# Patient Record
Sex: Male | Born: 1945
Health system: Southern US, Community
[De-identification: ages and names within clinical notes are randomized; demographics above are authoritative.]

## PROBLEM LIST (undated history)

## (undated) DIAGNOSIS — I447 Left bundle-branch block, unspecified: Secondary | ICD-10-CM

## (undated) DIAGNOSIS — G20 Parkinson's disease: Secondary | ICD-10-CM

## (undated) DIAGNOSIS — K219 Gastro-esophageal reflux disease without esophagitis: Secondary | ICD-10-CM

## (undated) DIAGNOSIS — G20A1 Parkinson's disease without dyskinesia, without mention of fluctuations: Secondary | ICD-10-CM

## (undated) DIAGNOSIS — J439 Emphysema, unspecified: Secondary | ICD-10-CM

## (undated) DIAGNOSIS — Z8719 Personal history of other diseases of the digestive system: Secondary | ICD-10-CM

## (undated) DIAGNOSIS — I10 Essential (primary) hypertension: Secondary | ICD-10-CM

## (undated) DIAGNOSIS — M1990 Unspecified osteoarthritis, unspecified site: Secondary | ICD-10-CM

## (undated) HISTORY — PX: KNEE ARTHROSCOPY: SHX127

## (undated) HISTORY — PX: COLON SURGERY: SHX602

## (undated) HISTORY — PX: ANKLE FRACTURE SURGERY: SHX122

---

## 2006-10-28 ENCOUNTER — Ambulatory Visit (HOSPITAL_COMMUNITY): Admission: RE | Admit: 2006-10-28 | Discharge: 2006-10-28 | Payer: Self-pay | Admitting: Internal Medicine

## 2006-10-28 ENCOUNTER — Ambulatory Visit: Payer: Self-pay | Admitting: Internal Medicine

## 2006-11-26 ENCOUNTER — Ambulatory Visit: Payer: Self-pay | Admitting: Internal Medicine

## 2006-12-05 ENCOUNTER — Ambulatory Visit: Payer: Self-pay | Admitting: Internal Medicine

## 2006-12-05 ENCOUNTER — Ambulatory Visit (HOSPITAL_COMMUNITY): Admission: RE | Admit: 2006-12-05 | Discharge: 2006-12-05 | Payer: Self-pay | Admitting: Internal Medicine

## 2006-12-12 ENCOUNTER — Ambulatory Visit (HOSPITAL_COMMUNITY): Admission: RE | Admit: 2006-12-12 | Discharge: 2006-12-12 | Payer: Self-pay | Admitting: Internal Medicine

## 2006-12-24 ENCOUNTER — Ambulatory Visit (HOSPITAL_COMMUNITY): Admission: RE | Admit: 2006-12-24 | Discharge: 2006-12-24 | Payer: Self-pay | Admitting: Internal Medicine

## 2007-01-15 ENCOUNTER — Ambulatory Visit: Payer: Self-pay | Admitting: Internal Medicine

## 2007-05-05 ENCOUNTER — Ambulatory Visit (HOSPITAL_COMMUNITY): Admission: RE | Admit: 2007-05-05 | Discharge: 2007-05-05 | Payer: Self-pay | Admitting: Internal Medicine

## 2007-05-28 ENCOUNTER — Ambulatory Visit: Payer: Self-pay | Admitting: Internal Medicine

## 2011-04-03 NOTE — Assessment & Plan Note (Signed)
Jeff Greer, Jeff Greer               CHART#:  [id number]   DATE:  05/28/2007                       DOB:  [birth year]   Follow up epigastric pain, large hiatal hernia, Hemoccult positive  anemia.  Last seen January 15, 2007.  Prior colonoscopy and EGD  demonstrated no significant abnormalities aside from left-sided  diverticula and internal hemorrhoids.  He had a large hiatal hernia on  EGD.  This was confirmed on upper GI series.  Gallbladder ultrasound  also negative.  He did have a 9-mm nonspecific liver lesion on CT  previously.  This was repeated just on May 05, 2007.  I reviewed these  studies with Dr. Leanna Battles and our radiologist at [facility].  She  felt this was a simple benign lesion most likely representing either a  cyst or hemangioma, and did not feel any further evaluation was  warranted.  He has done well on Zegerid 40 mg orally daily, and wishes  to go back on omeprazole 20 mg orally b.i.d.  He has cut back  significantly on his Sundrop intake.  He has lost 5 pounds since his  last visit.  He feels he is doing very well.   Repeat CBC from April 29, 2007 demonstrated a normal white count, H and H  16.1 and 48.7, platelet count 212,000.   There is no family history of GI malignancy.   CURRENT MEDICATIONS:  See updated list.   ALLERGIES:  NO KNOWN DRUG ALLERGIES.   EXAMINATION:  He looks well.  Weight 195.5.  Height 5 feet 10 inches.  Temperature 98.1.  Blood pressure 150/90.  Pulse is 60.  SKIN:  Warm and dry.  There is no jaundice.  CHEST:  Lungs are clear to auscultation.  CARDIAC:  Regular rate and rhythm without murmur, gallop, or rub.  ABDOMEN:  Nondistended.  He has some scratches on his anterior abdominal  wall, which he says he got when he nearly fell out of a tree putting up  a ladder against a tree to [place] a deer stand.  Positive bowel  sounds.  Soft.  Entirely nontender without appreciable mass or  organomegaly.   ASSESSMENT:  Large hiatal  hernia/gastroesophageal reflux disease likely  the cause of the patient's recent symptoms.  Things have really settled  down with cutting back significantly on caffeinated carbonated beverages  and Zegerid 40 mg daily.  Because of cost, he would like to go back to  Prilosec 20 mg OTC.  I told him he could go ahead and do that, but I  would plan to go ahead and take 20 mg twice daily for now, and  eventually get down to once daily assuming  symptoms are under good control.  Unless something comes up, plan to see  this nice gentleman back in 6 months.       Jonathon Bellows, M.D.  Electronically Signed     RMR/MEDQ  D:  05/28/2007  T:  05/29/2007  Job:  [id number]   cc:   [name], MD

## 2011-04-06 NOTE — Op Note (Signed)
NAME:  Jeff Greer, Jeff Greer              ACCOUNT NO.:  [id number]   MEDICAL RECORD NO.:  [id number]          PATIENT TYPE:  AMB   LOCATION:  DAY                           FACILITY:  [facility]   PHYSICIAN:  R. Roetta Sessions, M.D. DATE OF BIRTH:  [birth year]   DATE OF PROCEDURE:  12/05/2006  DATE OF DISCHARGE:                               OPERATIVE REPORT   INDICATIONS FOR PROCEDURE:  The patient is a 65 year old gentleman with  a history of microcytic anemia, Hemoccult positive previously.  He  underwent a colonoscopy which revealed no significant findings.  He also  has vague upper abdominal pain, intermittent in nature.  Gallbladder  remains in situ, has not been evaluated thus far.  EGD is now being  done.  This approach has been discussed at length with the patient.  The  potential risks, benefits and alternatives have been discussed.  His  questions were answered. He is agreeable.  He had a CBC through our  office on 11/20/2007 which revealed a normal H&H 13.8, 42.3, normal, MCV  85.1, white count 6.2.  Please see documents in medical records for more  information.   PROCEDURE NOTE:  O2 saturation, blood pressure, pulse were monitored the  entire procedure.  Conscious sedation with Versed 3 mg IV, Demerol 75 mg  IV in divided doses.  Instrument Pentax video chip system.  Cetacaine  spray topical pharyngeal anesthesia.   FINDINGS:  Examination of the tubular esophagus revealed no mucosal  abnormalities.  EG junction was patent, easily traversed.  Stomach:  Gastric cavity was not empty.  There was quite a bit of the food  vegetable material in the fundus which precluded complete examination.  The stomach, otherwise inflated well with air.  Examination of the  gastric mucosa including retroflexion proximal stomach esophagogastric  junction demonstrated a large somewhat complex hiatal hernia.  This  appeared to be a diaphragmatic hernia only but there was quite a bit of  food debris in the  fundus for which I could not see several square  centimeters of the proximal gastric mucosa.  Pylorus was patent, easily  traversed.  Examination of the bulb, second portion revealed no  abnormalities.   Therapeutic/diagnostic maneuvers performed:  None.   The patient tolerated the procedure well, was reactive after endoscopy.   IMPRESSION:  Normal esophagus.  Large hiatal hernia.  Significant food  debris retained in the fundus precluded complete examination.  Large  hiatal hernia.  Otherwise gastric mucosa which was seen appeared normal,  patent pylorus, normal D1-D2.   RECOMMENDATIONS:  Will proceed with an upper GI series to image the  proximal stomach not seen well today and we will also obtain a  gallbladder ultrasound at the same time and make further recommendations  at the very near future.  He is to continue omeprazole for now.      Jonathon Bellows, M.D.  Electronically Signed     RMR/MEDQ  D:  12/05/2006  T:  12/05/2006  Job:  [id number]   cc:   Dr. Sherryll Burger - Internal Medicine

## 2011-04-06 NOTE — H&P (Signed)
NAME:  Jeff Greer, Jeff Greer              ACCOUNT NO.:  [id number]   MEDICAL RECORD NO.:  [id number]          PATIENT TYPE:  AMB   LOCATION:                                FACILITY:  [facility]   PHYSICIAN:  R. Roetta Sessions, M.D. DATE OF BIRTH:  03-Mar-1946   DATE OF ADMISSION:  11/26/2006  DATE OF DISCHARGE:  LH                              HISTORY & PHYSICAL   DATE OF VISIT:  11/26/2006   CHIEF COMPLAINT:  Followup of blood work.   HISTORY OF PRESENT ILLNESS:  Jeff Greer is a 65 year old gentleman who  recently underwent colonoscopy for what was believed to be screening  purposes, however, he told Jeff Greer that he had a history of anemia and  Hemoccult positive stools.  On colonoscopy he is found to have anal  papilla, long tortuous colon and left-sided diverticula, otherwise  unremarkable.  He underwent a CBC that day, with a hemoglobin mildly low  at 13 and an MCV was 82.5. Repeat hemoglobin on 11/18/2006 it was 13.8  and hematocrit 42.3. We received labs from Jeff Greer office that showed  that he had hemoglobin of 8.8 on 09/08/2007 with an MCV of 69 and a  ferritin of 6.  The patient has been on aspirin 81 mg daily.  He  complains of reflux off and on for about 3 years.  He saw Jeff Greer,  recently and was put on Prilosec which has helped with his heartburn  symptoms.  He complains about upper abdominal pain which may or may not  be related to meals.  He really has not paid much attention to when it  occurs however.  His wife states that it often happens with meals.  He  generally has a bowel movement every 2-3 days which is normal for him.  He denies any melena or rectal bleeding, although he states that he has  not paid much attention.  His weight has been stable.   CURRENT MEDICATIONS:  Aspirin 81 mg daily, metoprolol 50 mg daily,  omeprazole 20 mg daily.   ALLERGIES:  No known drug allergies.   PAST MEDICAL HISTORY:  Hypertension.   PAST SURGICAL HISTORY:  He has had surgery  for broken ankle; and other  minor surgeries for fractures.   FAMILY HISTORY:  Negative for colorectal cancer.   SOCIAL HISTORY:  He is married with 1 child.  He is employed in [employer].  He is very active and walks about 10 miles a day.  He  quit smoking over 20 years ago.  Rarely consumes alcohol.   REVIEW OF SYSTEMS:  Please see HPI for GI and Constitutional.  CARDIOPULMONARY:  No chest pain or shortness of breath.   PHYSICAL EXAMINATION:  VITAL SIGNS:  Weight 199.  Height 5 feet 10  inches.  Temperature 98.3, blood pressure 138/88, pulse 74.  GENERAL:  A pleasant, well-nourished, well-developed, Caucasian male in  no acute distress.  SKIN:  Warm and dry.  No jaundice.  HEENT:  Sclerae anicteric.  Oropharyngeal mucosa moist and pink.  No  lymphadenopathy.  CHEST:  Lungs are  clear to auscultation.  CARDIAC EXAM:  Reveals regular rate and rhythm.  No murmurs, rubs, or  gallops.  ABDOMEN:  Positive bowel sounds, soft, nondistended.  There is mild  tenderness in the right upper quadrant region, and just to the right of  the epigastrium to deep palpation. No organomegaly or masses.  No  rebound tenderness or guarding.  No abdominal bruits or hernias.  EXTREMITIES:  No edema.   IMPRESSION:  Jeff Greer is a 64 year old gentleman who back in August  of 2007 had a significant microcytic anemia with low ferritin, as  outlined above.  He also was Hemoccult positive at the time. We  performed what was felt to be a screening colonoscopy several weeks ago  which was unremarkable.  The patient states that she has had ongoing,  intermittent, upper abdominal pain.  We need to exclude the possibility  of peptic ulcer disease.  He has been on low-dose aspirin daily.  Gallbladder remains in situ and cannot be excluded as far as the cause  of the abdominal pain.  Currently his hemoglobin is normal which is  reassuring.   PLAN:  1. EGD in the near future with Jeff Greer.  I  discussed the risks,      alternatives, and benefits with regards to the risk of reaction to      medication, bleeding, infection, and perforation.  The patient is      agreeable to proceed.  He will hold aspirin for 3 days prior to      procedure.  2. Continue omeprazole as before.  3. If upper endoscopy is unremarkable, would pursue gallbladder      workup.      Jeff Greer, P.AJonathon Greer, M.D.  Electronically Signed    LL/MEDQ  D:  11/26/2006  T:  11/26/2006  Job:  [id number]

## 2011-04-06 NOTE — Op Note (Signed)
NAME:  Jeff Greer, Jeff Greer              ACCOUNT NO.:  [id number]   MEDICAL RECORD NO.:  [id number]          PATIENT TYPE:  AMB   LOCATION:  DAY                           FACILITY:  [facility]   PHYSICIAN:  R. Roetta Sessions, M.D. DATE OF BIRTH:  [birth year]   DATE OF PROCEDURE:  10/28/2006  DATE OF DISCHARGE:                               OPERATIVE REPORT   PROCEDURE:  Screening colonoscopy.   INDICATIONS FOR PROCEDURE:  Patient is a 65 year old Caucasian male  devoid of any lower GI tract symptoms referred by Dr. Sherryll Burger in [place],  [place], for a colorectal cancer screening. Mr. Lemming tells me  has no symptoms but also tells me he was found to have a low blood  count and was told he is found have blood in stool, although Mr.  Antonini denies odynophagia, dysphagia, __________ or reflux symptoms,  nausea, vomiting, abdominal pain, change in weight, melena or rectal  bleeding or any other change in bowel function.  There is no family  history of colorectal neoplasia, placed takes one 81 mg aspirin daily.  Colonoscopy is now being done.  This approach has been discussed with  the patient at length.  Potential risks, benefits and alternatives have  been reviewed, questions answered and she is agreeable.  Please see  documentation in the medical record.   PROCEDURE NOTE:  O2 saturation, blood pressure, pulse and respirations  were monitor throughout the entire procedure.   CONSCIOUS SEDATION:  Versed 5 mg IV, Demerol 100 mg IV in divided doses.   INSTRUMENT:  Olympus video chip system.   FINDINGS:  Digital rectal exam revealed no abnormalities.  The prep was  good.   Colon:  Colonic mucosa was surveyed from the rectosigmoid junction to  the left transverse and right colon to the appendiceal orifice,  ileocecal valve and cecum.  These structures were well seen and  photographed for the record.  From this level, the scope was withdrawn  and all previously mentioned mucosal surfaces were  again seen.  The  patient had a long tortuous colon requiring number of positions to reach  the cecum.  Ultimately, the patient was placed in the supine position  for the cecum to be intubated.  The patient a long tortuous colon, he  had a left-sided diverticula, however, the remainder of the colonic  mucosa appeared normal.  Scope was pulled back down into the rectum  where thorough examination of the rectal mucosa including retroflexed  view of the anal verge was undertaken.  The patient had three anal  papilla, otherwise the rectal mucosa appeared normal.  The patient  tolerated the procedure well and was reactive.   ENDOSCOPIC IMPRESSION:  Anal papilla, otherwise normal rectum, long  tortuous colon, left-sided diverticula.  Remainder of colon mucosa  appeared normal.   The patient tells me that he has a low blood count and blood was found  in his stool although he was referred by Dr. Margaretmary Eddy office for  screening.   RECOMMENDATIONS:  Will check a CBC today and see where we stand and go  from there.  Jonathon Bellows, M.D.  Electronically Signed     RMR/MEDQ  D:  10/28/2006  T:  10/28/2006  Job:  [id number]   cc:   Dr. Janene Madeira, [place]

## 2014-11-19 DIAGNOSIS — Z9289 Personal history of other medical treatment: Secondary | ICD-10-CM

## 2014-11-19 HISTORY — DX: Personal history of other medical treatment: Z92.89

## 2015-11-02 ENCOUNTER — Encounter (HOSPITAL_COMMUNITY): Payer: Self-pay

## 2015-11-02 ENCOUNTER — Other Ambulatory Visit: Payer: Self-pay

## 2015-11-02 ENCOUNTER — Encounter (HOSPITAL_COMMUNITY)
Admission: RE | Admit: 2015-11-02 | Discharge: 2015-11-02 | Disposition: A | Discharge status: Home / Self Care | Payer: Medicare Other | Source: Ambulatory Visit | Attending: Orthopaedic Surgery | Admitting: Orthopaedic Surgery

## 2015-11-02 ENCOUNTER — Ambulatory Visit (HOSPITAL_COMMUNITY)
Admission: RE | Admit: 2015-11-02 | Discharge: 2015-11-02 | Disposition: A | Discharge status: Home / Self Care | Payer: Medicare Other | Source: Ambulatory Visit | Attending: Orthopedic Surgery | Admitting: Orthopedic Surgery

## 2015-11-02 DIAGNOSIS — J449 Chronic obstructive pulmonary disease, unspecified: Secondary | ICD-10-CM | POA: Insufficient documentation

## 2015-11-02 DIAGNOSIS — Z01810 Encounter for preprocedural cardiovascular examination: Secondary | ICD-10-CM | POA: Insufficient documentation

## 2015-11-02 DIAGNOSIS — I447 Left bundle-branch block, unspecified: Secondary | ICD-10-CM | POA: Diagnosis not present

## 2015-11-02 DIAGNOSIS — R9431 Abnormal electrocardiogram [ECG] [EKG]: Secondary | ICD-10-CM | POA: Diagnosis not present

## 2015-11-02 DIAGNOSIS — Z01812 Encounter for preprocedural laboratory examination: Secondary | ICD-10-CM | POA: Insufficient documentation

## 2015-11-02 DIAGNOSIS — Z01818 Encounter for other preprocedural examination: Secondary | ICD-10-CM | POA: Diagnosis not present

## 2015-11-02 DIAGNOSIS — I10 Essential (primary) hypertension: Secondary | ICD-10-CM | POA: Diagnosis not present

## 2015-11-02 DIAGNOSIS — J984 Other disorders of lung: Secondary | ICD-10-CM | POA: Diagnosis not present

## 2015-11-02 DIAGNOSIS — K449 Diaphragmatic hernia without obstruction or gangrene: Secondary | ICD-10-CM | POA: Insufficient documentation

## 2015-11-02 HISTORY — DX: Gastro-esophageal reflux disease without esophagitis: K21.9

## 2015-11-02 HISTORY — DX: Parkinson's disease without dyskinesia, without mention of fluctuations: G20.A1

## 2015-11-02 HISTORY — DX: Emphysema, unspecified: J43.9

## 2015-11-02 HISTORY — DX: Unspecified osteoarthritis, unspecified site: M19.90

## 2015-11-02 HISTORY — DX: Essential (primary) hypertension: I10

## 2015-11-02 HISTORY — DX: Personal history of other diseases of the digestive system: Z87.19

## 2015-11-02 HISTORY — DX: Parkinson's disease: G20

## 2015-11-02 LAB — COMPREHENSIVE METABOLIC PANEL
ALK PHOS: 75 U/L (ref 38–126)
ALT: 10 U/L — AB (ref 17–63)
AST: 20 U/L (ref 15–41)
Albumin: 3.9 g/dL (ref 3.5–5.0)
Anion gap: 7 (ref 5–15)
BUN: 13 mg/dL (ref 6–20)
CALCIUM: 9.1 mg/dL (ref 8.9–10.3)
CO2: 27 mmol/L (ref 22–32)
CREATININE: 0.8 mg/dL (ref 0.61–1.24)
Chloride: 106 mmol/L (ref 101–111)
GFR calc non Af Amer: >60 mL/min (ref >60)
GLUCOSE: 95 mg/dL (ref 65–99)
Potassium: 3.9 mmol/L (ref 3.5–5.1)
SODIUM: 140 mmol/L (ref 135–145)
Total Bilirubin: 0.4 mg/dL (ref 0.3–1.2)
Total Protein: 6.9 g/dL (ref 6.5–8.1)

## 2015-11-02 LAB — CBC WITH DIFFERENTIAL/PLATELET
Basophils Absolute: 0 10*3/uL (ref 0.0–0.1)
Basophils Relative: 0 %
EOS ABS: 0.4 10*3/uL (ref 0.0–0.7)
Eosinophils Relative: 6 %
HCT: 43.9 % (ref 39.0–52.0)
HEMOGLOBIN: 14.6 g/dL (ref 13.0–17.0)
LYMPHS ABS: 2.4 10*3/uL (ref 0.7–4.0)
LYMPHS PCT: 33 %
MCH: 31.7 pg (ref 26.0–34.0)
MCHC: 33.3 g/dL (ref 30.0–36.0)
MCV: 95.2 fL (ref 78.0–100.0)
Monocytes Absolute: 0.6 10*3/uL (ref 0.1–1.0)
Monocytes Relative: 8 %
NEUTROS PCT: 53 %
Neutro Abs: 3.9 10*3/uL (ref 1.7–7.7)
Platelets: 215 10*3/uL (ref 150–400)
RBC: 4.61 MIL/uL (ref 4.22–5.81)
RDW: 12.6 % (ref 11.5–15.5)
WBC: 7.3 10*3/uL (ref 4.0–10.5)

## 2015-11-02 LAB — URINALYSIS, ROUTINE W REFLEX MICROSCOPIC
Bilirubin Urine: NEGATIVE
Glucose, UA: NEGATIVE mg/dL
Hgb urine dipstick: NEGATIVE
KETONES UR: NEGATIVE mg/dL
NITRITE: NEGATIVE
PROTEIN: NEGATIVE mg/dL
Specific Gravity, Urine: 1.015 (ref 1.005–1.030)
pH: 7 (ref 5.0–8.0)

## 2015-11-02 LAB — PROTIME-INR
INR: 1.07 (ref 0.00–1.49)
Prothrombin Time: 14.1 seconds (ref 11.6–15.2)

## 2015-11-02 LAB — URINE MICROSCOPIC-ADD ON
Bacteria, UA: NONE SEEN
RBC / HPF: NONE SEEN RBC/hpf (ref 0–5)

## 2015-11-02 LAB — ABO/RH: ABO/RH(D): O POS

## 2015-11-02 LAB — TYPE AND SCREEN
ABO/RH(D): O POS
Antibody Screen: NEGATIVE

## 2015-11-02 LAB — SURGICAL PCR SCREEN
MRSA, PCR: NEGATIVE
STAPHYLOCOCCUS AUREUS: POSITIVE — AB

## 2015-11-02 LAB — APTT: aPTT: 30 seconds (ref 24–37)

## 2015-11-02 NOTE — Pre-Procedure Instructions (Signed)
Jeff Greer  11/02/2015      [facility] [address], [place] - [address] [place] [address] Phone: [phone] Fax: [phone]    Your procedure is scheduled on    Tuesday  11/08/15   Report to [facility] Admitting at 530 A.M.  Call this number if you have problems the morning of surgery:  [phone]   Remember:  Do not eat food or drink liquids after midnight.  Take these medicines the morning of surgery with A SIP OF WATER   CARBIDOPA-LEVODOPA, METOPROLOL(TOPROL), OMEPRAZOLE , TRIHEXPHENIDYL (STOP ASPIRIN, VITAMIN, DICLOFENAC)   Do not wear jewelry, make-up or nail polish.  Do not wear lotions, powders, or perfumes.  You may wear deodorant.  Do not shave 48 hours prior to surgery.  Men may shave face and neck.  Do not bring valuables to the hospital.  [facility] is not responsible for any belongings or valuables.  Contacts, dentures or bridgework may not be worn into surgery.  Leave your suitcase in the car.  After surgery it may be brought to your room.  For patients admitted to the hospital, discharge time will be determined by your treatment team.  Patients discharged the day of surgery will not be allowed to drive home.   Name and phone number of your driver:  Special instructions:  [facility] - Preparing for Surgery  Before surgery, you can play an important role.  Because skin is not sterile, your skin needs to be as free of germs as possible.  You can reduce the number of germs on you skin by washing with CHG (chlorahexidine gluconate) soap before surgery.  CHG is an antiseptic cleaner which kills germs and bonds with the skin to continue killing germs even after washing.  Please DO NOT use if you have an allergy to CHG or antibacterial soaps.  If your skin becomes reddened/irritated stop using the CHG and inform your nurse when you arrive at Short Stay.  Do not shave (including legs and underarms) for at  least 48 hours prior to the first CHG shower.  You may shave your face.  Please follow these instructions carefully:   1.  Shower with CHG Soap the night before surgery and the                                morning of Surgery.  2.  If you choose to wash your hair, wash your hair first as usual with your       normal shampoo.  3.  After you shampoo, rinse your hair and body thoroughly to remove the                      Shampoo.  4.  Use CHG as you would any other liquid soap.  You can apply chg directly       to the skin and wash gently with scrungie or a clean washcloth.  5.  Apply the CHG Soap to your body ONLY FROM THE NECK DOWN.        Do not use on open wounds or open sores.  Avoid contact with your eyes,       ears, mouth and genitals (private parts).  Wash genitals (private parts)       with your normal soap.  6.  Wash thoroughly, paying special attention to  the area where your surgery        will be performed.  7.  Thoroughly rinse your body with warm water from the neck down.  8.  DO NOT shower/wash with your normal soap after using and rinsing off       the CHG Soap.  9.  Pat yourself dry with a clean towel.            10.  Wear clean pajamas.            11.  Place clean sheets on your bed the night of your first shower and do not        sleep with pets.  Day of Surgery  Do not apply any lotions/deoderants the morning of surgery.  Please wear clean clothes to the hospital/surgery center.    Please read over the following fact sheets that you were given. Pain Booklet, Coughing and Deep Breathing, Blood Transfusion Information, MRSA Information and Surgical Site Infection Prevention

## 2015-11-02 NOTE — Progress Notes (Signed)
REQUESTED STRESS TEST FROM DR. [facility] .

## 2015-11-02 NOTE — Progress Notes (Signed)
REQUESTED STRESS TEST FROM DR. [facility] IN [place].

## 2015-11-03 NOTE — Progress Notes (Addendum)
Anesthesia Chart Review:  Pt is 69 year old male scheduled for L total knee arthroplasty on 11/08/2015 with Dr. Durward Fortes.   PMH includes:  HTN, emphysema, Parkinson's disease, GERD. Former smoker. BMI 27.   Medications include: ASA, carbidopa-levodopa, lisinopril, metoprolol, prilosec, trihexyphenidyl.   Preoperative labs reviewed.    Chest x-ray 11/02/15 reviewed.  1. COPD and bilateral pleural-parenchymal scarring. 2. Prominent sliding hiatal hernia.  EKG 11/02/15: Sinus rhythm with Fusion complexes. LBBB.   Nuclear stress test 05/17/10:  - Small defect of anteroseptum which was slightly more significant on the resting study compared with the rest study. This appears to be due to motion artifacts. The remainder of the walls were normally perfused. There was no significant ischemia noted.  * Note stress test report indicates LBBB was noted on resting EKG  If no changes, I anticipate pt can proceed with surgery as scheduled.   Willeen Cass, FNP-BC [facility] Short Stay Surgical Center/Anesthesiology Phone: [phone] 11/04/2015 3:26 PM

## 2015-11-03 NOTE — H&P (Signed)
CHIEF COMPLAINT:  Painful left knee.    HISTORY OF PRESENT ILLNESS:  Jeff Greer is a very pleasant 69 year old white male who is seen today for evaluation of his left knee.  He has had problems with his left knee with an acute onset of pain and discomfort in April 2016.  He did not have any history of injury or trauma at that time, but he is a logger and works with heavy equipment, but did not remember a particular incident that caused his pain.  He had a lot of popping sensation and his knee was feeling as if it was giving way.  He had one incident where the knee pain caused him to fall at that time.  He did have a corticosteroid injection done on May 9, but apparently it did not make much of a difference at that time.  He has iced it, used Ben-Gay and actually taken hydrocodone.  He continued to have pain and discomfort in the knee.  Was seen in our practice on March 07, 2015.  An MRI scan was ordered, which showed a large complex tear in the posterior horn and posterior body of the medial meniscus with a horizontal component reaching the meniscal surface and a large radial component.  A meniscal fragment was displaced along the medial femoral condyle, according to the MRI scan.  Lateral meniscus did have a large radial tear on MRI also.  There was some thinning and irregularity of the medial compartment noted at that time.  Arthroscopic surgery was performed on Apr 14, 2015, encompassing a left knee partial medial and lateral meniscectomy with chondroplasty of the medial femoral condyle.  He did have grade IV changes on the medial femoral condyle.  He was not having any swelling and his significant problem was, though, that when he got from a sitting position, it took him 3 or 4 steps before he felt better.  His last visit was in June 2016 for his knee arthroscopy.  He returned back on September 28, 2015, with recurrent pain and a corticosteroid injection was given.  When he followed back up, he was noted to  have continued pain.  He returns today for re-evaluation.     PAST MEDICAL HISTORY:  In general, his health is fair.     HOSPITALIZATIONS:   1.  In 2013, repair of an intestinal tear. 2.  On 04/13/2015, scope of the left knee with partial medial and lateral meniscectomy and chondroplasty. 3.  In 1983 for right ankle ORIF, fracture.    MEDICATIONS:     1.  Carbidopa/levodopa 25 mg t.i.d.  2.  Trihexyphenidyl 2 mg daily.  3.  Metoprolol 50 mg daily. 4.  Lisinopril 10 mg daily.  5.  Diclofenac 75 mg b.i.d.  6.  Aspirin 81 mg daily.  7.  Omeprazole 20 mg daily.    ALLERGIES:  NONE KNOWN.     REVIEW OF SYSTEMS:  A 14-point review of systems is positive for Parkinson disease, which he states is mild.  He is on medication for that.  He also has had hypertension for 41 years or so and is on meds for that.     FAMILY HISTORY:  Positive for a mother who died at age 21 from a brain tumor.  Father died at age 71 from emphysema.  He had 1 brother who was killed in a motor vehicle accident.  He had 3 other brothers, 33 year old twins and one 50 year old.  All had diabetes mellitus.  One  of the twins had open heart surgery.  He had no sisters.    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  He quit smoking cigarettes in 1985; prior that, he was smoking 2-3 packs a day for 25 years.  No use of alcohol.     PHYSICAL EXAMINATION:  Exam today reveals a very pleasant 69 year old white male, well developed, well nourished, alert, pleasant, cooperative, in moderate distress secondary to left knee pain.  He is 67-1/2 inches in height, weight 188 pounds, BMI 29.  Vital signs reveal a temperature of 98.6, pulse 64, respirations 16, blood pressure 140/78.   Head:  Normocephalic.  Eyes:  Pupils equal, round and reactive to light and accommodation with extraocular movements intact.  Ears, nose and throat:  Benign. Neck:  Supple.  No carotid bruits.  Chest:  Good expansion.     Lungs:  Essentially clear. Cardiac:   Regular rhythm and rate.  Normal S1, S2.  No murmurs were noted.     Pulses:  1+ bilateral and symmetric in the lower extremities. Abdomen:  Scaphoid, soft, nontender.  No masses palpable.  Normal bowel sounds present.  Surgical incision was noted.  Genitorectal/breast:  Exam not indicated for an orthopedic evaluation.   CNS:  He is oriented x3.  Cranial nerves II-XII grossly intact.   Musculoskeletal:  He has range of motion of the left knee from about 5 degrees to about 105 degrees.  He does have just a trace effusion.  He is mildly varus at this time at the medial joint line.  He does have patellofemoral crepitus with range of motion.  Calf is supple, nontender.      RADIOGRAPHS:  Radiographic studies reveal an osteochondral lesion at the mid-to-medial portion of the distal femoral condyle.  He is markedly sclerosed at that area.  Certainly has narrowing of the medial compartment.  Does have a little bit of patellofemoral OA.     IMPRESSION:   1.  Osteochondral lesion of the medial femoral condyle with marked narrowing of the medial joint.   2.  History of hypertension.   3.  History of Parkinson.     RECOMMENDATIONS:  Plan for left total knee arthroplasty.  Procedure risks and benefits were fully explained to the patient and he is understanding and would like to proceed in the next several weeks.     Jeff Greer [place], [place] [phone]  11/03/2015 4:27 PM

## 2015-11-04 LAB — URINE CULTURE: Culture: 7000

## 2015-11-07 MED ORDER — CEFAZOLIN SODIUM-DEXTROSE 2-3 GM-% IV SOLR
2.0000 g | INTRAVENOUS | Status: AC
  Administered 2015-11-08: 2 g via INTRAVENOUS
  Filled 2015-11-07: qty 50

## 2015-11-07 MED ORDER — CHLORHEXIDINE GLUCONATE 4 % EX LIQD: 60.0000 mL | Freq: Once | CUTANEOUS | Status: DC

## 2015-11-07 MED ORDER — SODIUM CHLORIDE 0.9 % IV SOLN
2000.0000 mg | INTRAVENOUS | Status: AC
  Administered 2015-11-08: 2000 mg via TOPICAL
  Filled 2015-11-07: qty 20

## 2015-11-07 MED ORDER — SODIUM CHLORIDE 0.9 % IV SOLN: INTRAVENOUS | Status: DC

## 2015-11-07 NOTE — Anesthesia Preprocedure Evaluation (Addendum)
Anesthesia Evaluation  Patient identified by MRN, date of birth, ID band Patient awake    Reviewed: Allergy & Precautions, H&P , NPO status , Patient's Chart, lab work & pertinent test results, reviewed documented beta blocker date and time   Airway Mallampati: I  TM Distance: >3 FB Neck ROM: Full    Dental no notable dental hx. (+) Edentulous Upper, Edentulous Lower, Dental Advisory Given   Pulmonary COPD, former smoker,    Pulmonary exam normal breath sounds clear to auscultation       Cardiovascular hypertension, Pt. on medications and Pt. on home beta blockers  Rhythm:Regular Rate:Normal     Neuro/Psych Parkinson's dz negative neurological ROS  negative psych ROS   GI/Hepatic Neg liver ROS, GERD  Medicated and Controlled,  Endo/Other  negative endocrine ROS  Renal/GU negative Renal ROS  negative genitourinary   Musculoskeletal  (+) Arthritis , Osteoarthritis,    Abdominal   Peds  Hematology negative hematology ROS (+)   Anesthesia Other Findings   Reproductive/Obstetrics negative OB ROS                           Anesthesia Physical Anesthesia Plan  ASA: III  Anesthesia Plan: MAC and Spinal   Post-op Pain Management: MAC Combined w/ Regional for Post-op pain   Induction: Intravenous  Airway Management Planned: Simple Face Mask  Additional Equipment:   Intra-op Plan:   Post-operative Plan:   Informed Consent: I have reviewed the patients History and Physical, chart, labs and discussed the procedure including the risks, benefits and alternatives for the proposed anesthesia with the patient or authorized representative who has indicated his/her understanding and acceptance.   Dental advisory given  Plan Discussed with: CRNA  Anesthesia Plan Comments:         Anesthesia Quick Evaluation

## 2015-11-08 ENCOUNTER — Inpatient Hospital Stay (HOSPITAL_COMMUNITY): Payer: Medicare Other | Admitting: Emergency Medicine

## 2015-11-08 ENCOUNTER — Inpatient Hospital Stay (HOSPITAL_COMMUNITY): Payer: Medicare Other | Admitting: Anesthesiology

## 2015-11-08 ENCOUNTER — Encounter (HOSPITAL_COMMUNITY)
Admission: RE | Disposition: A | Discharge status: Home Health | Payer: Self-pay | Source: Ambulatory Visit | Attending: Orthopaedic Surgery

## 2015-11-08 ENCOUNTER — Encounter (HOSPITAL_COMMUNITY): Payer: Self-pay | Admitting: *Deleted

## 2015-11-08 ENCOUNTER — Inpatient Hospital Stay (HOSPITAL_COMMUNITY)
Admission: RE | Admit: 2015-11-08 | Discharge: 2015-11-10 | DRG: 470 | Disposition: A | Discharge status: Home Health | Payer: Medicare Other | Source: Ambulatory Visit | Attending: Orthopaedic Surgery | Admitting: Orthopaedic Surgery

## 2015-11-08 DIAGNOSIS — I10 Essential (primary) hypertension: Secondary | ICD-10-CM

## 2015-11-08 DIAGNOSIS — M1712 Unilateral primary osteoarthritis, left knee: Secondary | ICD-10-CM | POA: Diagnosis present

## 2015-11-08 DIAGNOSIS — Z96659 Presence of unspecified artificial knee joint: Secondary | ICD-10-CM

## 2015-11-08 DIAGNOSIS — Z87891 Personal history of nicotine dependence: Secondary | ICD-10-CM

## 2015-11-08 DIAGNOSIS — K219 Gastro-esophageal reflux disease without esophagitis: Secondary | ICD-10-CM | POA: Diagnosis present

## 2015-11-08 DIAGNOSIS — M25562 Pain in left knee: Secondary | ICD-10-CM | POA: Diagnosis present

## 2015-11-08 DIAGNOSIS — G20 Parkinson's disease: Secondary | ICD-10-CM | POA: Diagnosis present

## 2015-11-08 DIAGNOSIS — J449 Chronic obstructive pulmonary disease, unspecified: Secondary | ICD-10-CM | POA: Diagnosis present

## 2015-11-08 HISTORY — PX: TOTAL KNEE ARTHROPLASTY: SHX125

## 2015-11-08 SURGERY — ARTHROPLASTY, KNEE, TOTAL
Anesthesia: Monitor Anesthesia Care | Site: Knee | Laterality: Left

## 2015-11-08 MED ORDER — FENTANYL CITRATE (PF) 250 MCG/5ML IJ SOLN
INTRAMUSCULAR | Status: AC
  Filled 2015-11-08: qty 5

## 2015-11-08 MED ORDER — METOPROLOL SUCCINATE ER 50 MG PO TB24
50.0000 mg | ORAL_TABLET | Freq: Every day | ORAL | Status: DC
  Administered 2015-11-08 – 2015-11-10 (×3): 50 mg via ORAL
  Filled 2015-11-08 (×3): qty 1

## 2015-11-08 MED ORDER — ONDANSETRON HCL 4 MG/2ML IJ SOLN: 4.0000 mg | Freq: Four times a day (QID) | INTRAMUSCULAR | Status: DC | PRN

## 2015-11-08 MED ORDER — 0.9 % SODIUM CHLORIDE (POUR BTL) OPTIME
TOPICAL | Status: DC | PRN
  Administered 2015-11-08: 1000 mL

## 2015-11-08 MED ORDER — DOCUSATE SODIUM 100 MG PO CAPS
100.0000 mg | ORAL_CAPSULE | Freq: Two times a day (BID) | ORAL | Status: DC
  Administered 2015-11-08 – 2015-11-10 (×4): 100 mg via ORAL
  Filled 2015-11-08 (×4): qty 1

## 2015-11-08 MED ORDER — LACTATED RINGERS IV SOLN
INTRAVENOUS | Status: DC | PRN
  Administered 2015-11-08 (×2): via INTRAVENOUS

## 2015-11-08 MED ORDER — GLYCOPYRROLATE 0.2 MG/ML IJ SOLN
INTRAMUSCULAR | Status: AC
  Filled 2015-11-08: qty 1

## 2015-11-08 MED ORDER — KETOROLAC TROMETHAMINE 15 MG/ML IJ SOLN
15.0000 mg | Freq: Four times a day (QID) | INTRAMUSCULAR | Status: AC
  Administered 2015-11-08 (×3): 15 mg via INTRAVENOUS
  Filled 2015-11-08 (×3): qty 1

## 2015-11-08 MED ORDER — DEXTROSE 5 % IV SOLN
500.0000 mg | Freq: Four times a day (QID) | INTRAVENOUS | Status: DC | PRN
  Filled 2015-11-08: qty 5

## 2015-11-08 MED ORDER — ROCURONIUM BROMIDE 50 MG/5ML IV SOLN
INTRAVENOUS | Status: AC
  Filled 2015-11-08: qty 1

## 2015-11-08 MED ORDER — PROPOFOL 10 MG/ML IV BOLUS
INTRAVENOUS | Status: DC | PRN
  Administered 2015-11-08: 10 mg via INTRAVENOUS

## 2015-11-08 MED ORDER — TRIHEXYPHENIDYL HCL 2 MG PO TABS
2.0000 mg | ORAL_TABLET | Freq: Every day | ORAL | Status: DC
  Administered 2015-11-09: 2 mg via ORAL
  Filled 2015-11-08 (×3): qty 1

## 2015-11-08 MED ORDER — BUPIVACAINE IN DEXTROSE 0.75-8.25 % IT SOLN
INTRATHECAL | Status: DC | PRN
  Administered 2015-11-08: 15 mg via INTRATHECAL

## 2015-11-08 MED ORDER — ADULT MULTIVITAMIN W/MINERALS CH
1.0000 | ORAL_TABLET | Freq: Every day | ORAL | Status: DC
  Administered 2015-11-09 – 2015-11-10 (×2): 1 via ORAL
  Filled 2015-11-08 (×2): qty 1

## 2015-11-08 MED ORDER — HYDROMORPHONE HCL 1 MG/ML IJ SOLN
0.5000 mg | INTRAMUSCULAR | Status: DC | PRN
  Administered 2015-11-09 – 2015-11-10 (×3): 1 mg via INTRAVENOUS
  Filled 2015-11-08 (×3): qty 1

## 2015-11-08 MED ORDER — MIDAZOLAM HCL 5 MG/5ML IJ SOLN
INTRAMUSCULAR | Status: DC | PRN
  Administered 2015-11-08 (×2): 1 mg via INTRAVENOUS

## 2015-11-08 MED ORDER — MENTHOL 3 MG MT LOZG: 1.0000 | LOZENGE | OROMUCOSAL | Status: DC | PRN

## 2015-11-08 MED ORDER — ALUM & MAG HYDROXIDE-SIMETH 200-200-20 MG/5ML PO SUSP: 30.0000 mL | ORAL | Status: DC | PRN

## 2015-11-08 MED ORDER — METOCLOPRAMIDE HCL 5 MG PO TABS: 5.0000 mg | ORAL_TABLET | Freq: Three times a day (TID) | ORAL | Status: DC | PRN

## 2015-11-08 MED ORDER — RIVAROXABAN 10 MG PO TABS
10.0000 mg | ORAL_TABLET | Freq: Every day | ORAL | Status: DC
  Administered 2015-11-09 – 2015-11-10 (×2): 10 mg via ORAL
  Filled 2015-11-08 (×2): qty 1

## 2015-11-08 MED ORDER — BUPIVACAINE-EPINEPHRINE (PF) 0.25% -1:200000 IJ SOLN
INTRAMUSCULAR | Status: AC
  Filled 2015-11-08: qty 30

## 2015-11-08 MED ORDER — POLYETHYLENE GLYCOL 3350 17 G PO PACK: 17.0000 g | PACK | Freq: Every day | ORAL | Status: DC | PRN

## 2015-11-08 MED ORDER — SODIUM CHLORIDE 0.9 % IR SOLN
Status: DC | PRN
  Administered 2015-11-08: 3000 mL

## 2015-11-08 MED ORDER — MAGNESIUM CITRATE PO SOLN
1.0000 | Freq: Once | ORAL | Status: DC | PRN
Start: 2015-11-08 — End: 2015-11-10

## 2015-11-08 MED ORDER — LIDOCAINE HCL (CARDIAC) 20 MG/ML IV SOLN
INTRAVENOUS | Status: AC
  Filled 2015-11-08: qty 5

## 2015-11-08 MED ORDER — FENTANYL CITRATE (PF) 100 MCG/2ML IJ SOLN
INTRAMUSCULAR | Status: DC | PRN
  Administered 2015-11-08: 50 ug via INTRAVENOUS

## 2015-11-08 MED ORDER — METHOCARBAMOL 500 MG PO TABS
500.0000 mg | ORAL_TABLET | Freq: Four times a day (QID) | ORAL | Status: DC | PRN
  Administered 2015-11-08: 500 mg via ORAL
  Filled 2015-11-08: qty 1

## 2015-11-08 MED ORDER — ONDANSETRON HCL 4 MG/2ML IJ SOLN
INTRAMUSCULAR | Status: AC
  Filled 2015-11-08: qty 2

## 2015-11-08 MED ORDER — MIDAZOLAM HCL 2 MG/2ML IJ SOLN
INTRAMUSCULAR | Status: AC
  Filled 2015-11-08: qty 2

## 2015-11-08 MED ORDER — SODIUM CHLORIDE 0.9 % IJ SOLN
INTRAMUSCULAR | Status: AC
  Filled 2015-11-08: qty 10

## 2015-11-08 MED ORDER — SUCCINYLCHOLINE CHLORIDE 20 MG/ML IJ SOLN
INTRAMUSCULAR | Status: AC
  Filled 2015-11-08: qty 1

## 2015-11-08 MED ORDER — PHENYLEPHRINE HCL 10 MG/ML IJ SOLN
INTRAMUSCULAR | Status: DC | PRN
  Administered 2015-11-08 (×2): 40 ug via INTRAVENOUS
  Administered 2015-11-08: 80 ug via INTRAVENOUS
  Administered 2015-11-08 (×2): 40 ug via INTRAVENOUS
  Administered 2015-11-08 (×3): 80 ug via INTRAVENOUS
  Administered 2015-11-08 (×2): 40 ug via INTRAVENOUS
  Administered 2015-11-08: 80 ug via INTRAVENOUS
  Administered 2015-11-08: 40 ug via INTRAVENOUS
  Administered 2015-11-08: 80 ug via INTRAVENOUS
  Administered 2015-11-08: 40 ug via INTRAVENOUS

## 2015-11-08 MED ORDER — LISINOPRIL 10 MG PO TABS
10.0000 mg | ORAL_TABLET | Freq: Every day | ORAL | Status: DC
  Administered 2015-11-08 – 2015-11-10 (×3): 10 mg via ORAL
  Filled 2015-11-08 (×3): qty 1

## 2015-11-08 MED ORDER — HYDROMORPHONE HCL 1 MG/ML IJ SOLN: 0.2500 mg | INTRAMUSCULAR | Status: DC | PRN

## 2015-11-08 MED ORDER — PROPOFOL 10 MG/ML IV BOLUS
INTRAVENOUS | Status: AC
  Filled 2015-11-08: qty 20

## 2015-11-08 MED ORDER — CARBIDOPA-LEVODOPA 25-100 MG PO TABS
1.0000 | ORAL_TABLET | Freq: Three times a day (TID) | ORAL | Status: DC
  Administered 2015-11-08 – 2015-11-10 (×5): 1 via ORAL
  Filled 2015-11-08 (×5): qty 1

## 2015-11-08 MED ORDER — EPHEDRINE SULFATE 50 MG/ML IJ SOLN
INTRAMUSCULAR | Status: AC
  Filled 2015-11-08: qty 1

## 2015-11-08 MED ORDER — ACETAMINOPHEN 10 MG/ML IV SOLN
1000.0000 mg | Freq: Four times a day (QID) | INTRAVENOUS | Status: AC
  Administered 2015-11-08 – 2015-11-09 (×4): 1000 mg via INTRAVENOUS
  Filled 2015-11-08 (×5): qty 100

## 2015-11-08 MED ORDER — PANTOPRAZOLE SODIUM 40 MG PO TBEC
40.0000 mg | DELAYED_RELEASE_TABLET | Freq: Every day | ORAL | Status: DC
  Administered 2015-11-08 – 2015-11-10 (×3): 40 mg via ORAL
  Filled 2015-11-08 (×3): qty 1

## 2015-11-08 MED ORDER — PHENYLEPHRINE 40 MCG/ML (10ML) SYRINGE FOR IV PUSH (FOR BLOOD PRESSURE SUPPORT)
PREFILLED_SYRINGE | INTRAVENOUS | Status: AC
  Filled 2015-11-08: qty 10

## 2015-11-08 MED ORDER — PHENOL 1.4 % MT LIQD: 1.0000 | OROMUCOSAL | Status: DC | PRN

## 2015-11-08 MED ORDER — BISACODYL 10 MG RE SUPP: 10.0000 mg | Freq: Every day | RECTAL | Status: DC | PRN

## 2015-11-08 MED ORDER — BUPIVACAINE-EPINEPHRINE 0.25% -1:200000 IJ SOLN
INTRAMUSCULAR | Status: DC | PRN
  Administered 2015-11-08: 30 mL

## 2015-11-08 MED ORDER — OXYCODONE HCL 5 MG PO TABS
5.0000 mg | ORAL_TABLET | ORAL | Status: DC | PRN
  Administered 2015-11-08 – 2015-11-10 (×6): 10 mg via ORAL
  Filled 2015-11-08 (×7): qty 2

## 2015-11-08 MED ORDER — PROPOFOL 500 MG/50ML IV EMUL
INTRAVENOUS | Status: DC | PRN
  Administered 2015-11-08: 50 ug/kg/min via INTRAVENOUS

## 2015-11-08 MED ORDER — METOCLOPRAMIDE HCL 5 MG/ML IJ SOLN: 5.0000 mg | Freq: Three times a day (TID) | INTRAMUSCULAR | Status: DC | PRN

## 2015-11-08 MED ORDER — KETOROLAC TROMETHAMINE 15 MG/ML IJ SOLN
7.5000 mg | Freq: Four times a day (QID) | INTRAMUSCULAR | Status: AC
  Administered 2015-11-09 (×3): 7.5 mg via INTRAVENOUS
  Filled 2015-11-08 (×3): qty 1

## 2015-11-08 MED ORDER — ONDANSETRON HCL 4 MG/2ML IJ SOLN
INTRAMUSCULAR | Status: DC | PRN
Start: 2015-11-08 — End: 2015-11-08
  Administered 2015-11-08: 4 mg via INTRAVENOUS

## 2015-11-08 MED ORDER — DIPHENHYDRAMINE HCL 12.5 MG/5ML PO ELIX: 12.5000 mg | ORAL_SOLUTION | ORAL | Status: DC | PRN

## 2015-11-08 MED ORDER — ONDANSETRON HCL 4 MG PO TABS: 4.0000 mg | ORAL_TABLET | Freq: Four times a day (QID) | ORAL | Status: DC | PRN

## 2015-11-08 MED ORDER — CEFAZOLIN SODIUM-DEXTROSE 2-3 GM-% IV SOLR
2.0000 g | Freq: Four times a day (QID) | INTRAVENOUS | Status: AC
  Administered 2015-11-08 – 2015-11-09 (×2): 2 g via INTRAVENOUS
  Filled 2015-11-08 (×2): qty 50

## 2015-11-08 MED ORDER — SODIUM CHLORIDE 0.9 % IV SOLN
INTRAVENOUS | Status: DC
  Administered 2015-11-08 – 2015-11-09 (×2): via INTRAVENOUS

## 2015-11-08 MED ORDER — BUPIVACAINE-EPINEPHRINE (PF) 0.5% -1:200000 IJ SOLN
INTRAMUSCULAR | Status: DC | PRN
  Administered 2015-11-08: 30 mL via PERINEURAL

## 2015-11-08 SURGICAL SUPPLY — 63 items
BAG DECANTER FOR FLEXI CONT (MISCELLANEOUS) ×3 IMPLANT
BANDAGE ESMARK 6X9 LF (GAUZE/BANDAGES/DRESSINGS) ×1 IMPLANT
BLADE SAGITTAL 25.0X1.19X90 (BLADE) ×2 IMPLANT
BLADE SAGITTAL 25.0X1.19X90MM (BLADE) ×1
BNDG ESMARK 6X9 LF (GAUZE/BANDAGES/DRESSINGS) ×3
BOWL SMART MIX CTS (DISPOSABLE) ×3 IMPLANT
CAP KNEE TOTAL 3 SIGMA ×3 IMPLANT
CEMENT HV SMART SET (Cement) ×6 IMPLANT
COVER SURGICAL LIGHT HANDLE (MISCELLANEOUS) ×3 IMPLANT
CUFF TOURNIQUET SINGLE 34IN LL (TOURNIQUET CUFF) ×3 IMPLANT
CUFF TOURNIQUET SINGLE 44IN (TOURNIQUET CUFF) IMPLANT
DRAPE EXTREMITY T 121X128X90 (DRAPE) ×3 IMPLANT
DRAPE PROXIMA HALF (DRAPES) ×3 IMPLANT
DRSG ADAPTIC 3X8 NADH LF (GAUZE/BANDAGES/DRESSINGS) ×3 IMPLANT
DRSG PAD ABDOMINAL 8X10 ST (GAUZE/BANDAGES/DRESSINGS) ×6 IMPLANT
DURAPREP 26ML APPLICATOR (WOUND CARE) ×6 IMPLANT
ELECT CAUTERY BLADE 6.4 (BLADE) ×3 IMPLANT
ELECT REM PT RETURN 9FT ADLT (ELECTROSURGICAL) ×3
ELECTRODE REM PT RTRN 9FT ADLT (ELECTROSURGICAL) ×1 IMPLANT
EVACUATOR 1/8 PVC DRAIN (DRAIN) IMPLANT
FACESHIELD WRAPAROUND (MASK) ×6 IMPLANT
GAUZE SPONGE 4X4 12PLY STRL (GAUZE/BANDAGES/DRESSINGS) ×3 IMPLANT
GLOVE BIOGEL PI IND STRL 8 (GLOVE) ×1 IMPLANT
GLOVE BIOGEL PI IND STRL 8.5 (GLOVE) ×1 IMPLANT
GLOVE BIOGEL PI INDICATOR 8 (GLOVE) ×2
GLOVE BIOGEL PI INDICATOR 8.5 (GLOVE) ×2
GLOVE ECLIPSE 8.0 STRL XLNG CF (GLOVE) ×6 IMPLANT
GLOVE SURG ORTHO 8.5 STRL (GLOVE) ×6 IMPLANT
GOWN STRL REUS W/ TWL LRG LVL3 (GOWN DISPOSABLE) ×2 IMPLANT
GOWN STRL REUS W/TWL 2XL LVL3 (GOWN DISPOSABLE) ×3 IMPLANT
GOWN STRL REUS W/TWL LRG LVL3 (GOWN DISPOSABLE) ×4
HANDPIECE INTERPULSE COAX TIP (DISPOSABLE) ×2
KIT BASIN OR (CUSTOM PROCEDURE TRAY) ×3 IMPLANT
KIT ROOM TURNOVER OR (KITS) ×3 IMPLANT
MANIFOLD NEPTUNE II (INSTRUMENTS) ×3 IMPLANT
NEEDLE 22X1 1/2 (OR ONLY) (NEEDLE) ×3 IMPLANT
NS IRRIG 1000ML POUR BTL (IV SOLUTION) ×3 IMPLANT
PACK TOTAL JOINT (CUSTOM PROCEDURE TRAY) ×3 IMPLANT
PACK UNIVERSAL I (CUSTOM PROCEDURE TRAY) ×3 IMPLANT
PAD ABD 8X10 STRL (GAUZE/BANDAGES/DRESSINGS) ×3 IMPLANT
PAD ARMBOARD 7.5X6 YLW CONV (MISCELLANEOUS) ×6 IMPLANT
PAD CAST 4YDX4 CTTN HI CHSV (CAST SUPPLIES) ×1 IMPLANT
PADDING CAST COTTON 4X4 STRL (CAST SUPPLIES) ×2
PADDING CAST COTTON 6X4 STRL (CAST SUPPLIES) ×3 IMPLANT
SET HNDPC FAN SPRY TIP SCT (DISPOSABLE) ×1 IMPLANT
STAPLER VISISTAT 35W (STAPLE) ×3 IMPLANT
SUCTION FRAZIER TIP 10 FR DISP (SUCTIONS) ×3 IMPLANT
SURGIFLO W/THROMBIN 8M KIT (HEMOSTASIS) IMPLANT
SUT BONE WAX W31G (SUTURE) ×3 IMPLANT
SUT ETHIBOND NAB CT1 #1 30IN (SUTURE) ×6 IMPLANT
SUT MNCRL AB 3-0 PS2 18 (SUTURE) ×3 IMPLANT
SUT MON AB 3-0 SH 27 (SUTURE) ×2
SUT MON AB 3-0 SH27 (SUTURE) ×1 IMPLANT
SUT VIC AB 0 CT1 27 (SUTURE) ×2
SUT VIC AB 0 CT1 27XBRD ANBCTR (SUTURE) ×1 IMPLANT
SUT VIC AB 2-0 CT1 27 (SUTURE) ×4
SUT VIC AB 2-0 CT1 TAPERPNT 27 (SUTURE) ×2 IMPLANT
SYR CONTROL 10ML LL (SYRINGE) IMPLANT
TOWEL OR 17X24 6PK STRL BLUE (TOWEL DISPOSABLE) ×3 IMPLANT
TOWEL OR 17X26 10 PK STRL BLUE (TOWEL DISPOSABLE) ×3 IMPLANT
TRAY FOLEY CATH 16FRSI W/METER (SET/KITS/TRAYS/PACK) IMPLANT
WATER STERILE IRR 1000ML POUR (IV SOLUTION) IMPLANT
WRAP KNEE MAXI GEL POST OP (GAUZE/BANDAGES/DRESSINGS) ×3 IMPLANT

## 2015-11-08 NOTE — Progress Notes (Signed)
Orthopedic Tech Progress Note Patient Details:  Jeff Greer 1946-07-27 [id number]  CPM Left Knee CPM Left Knee: On Left Knee Flexion (Degrees): 90 Left Knee Extension (Degrees): 0 Additional Comments: trapeze bar patient helper Viewed order from doctor's order list  Hildred Priest 11/08/2015, 10:37 AM

## 2015-11-08 NOTE — H&P (Signed)
  The recent History & Physical has been reviewed. I have personally examined the patient today. There is no interval change to the documented History & Physical. The patient would like to proceed with the procedure.  Jeff Greer W 11/08/2015,  7:13 AM

## 2015-11-08 NOTE — Transfer of Care (Signed)
Immediate Anesthesia Transfer of Care Note  Patient: Jeff Greer  Procedure(s) Performed: Procedure(s): TOTAL KNEE ARTHROPLASTY (Left)  Patient Location: PACU  Anesthesia Type:MAC and Spinal  Level of Consciousness: awake, alert , oriented and patient cooperative  Airway & Oxygen Therapy: Patient Spontanous Breathing and Patient connected to face mask oxygen  Post-op Assessment: Report given to RN and Post -op Vital signs reviewed and stable  Post vital signs: Reviewed  Last Vitals:  Filed Vitals:   11/08/15 0613  BP: 155/81  Pulse: 68  Temp: 36.5 C  Resp: 18    Complications: No apparent anesthesia complications

## 2015-11-08 NOTE — Progress Notes (Signed)
Patient ID: Jeff Greer, male   DOB: May 11, 1946, 69 y.o.   MRN: [id number] PATIENT ID:      Jeff Greer  MRN:     [id number] DOB/AGE:    [birth year] / 69 y.o.       OPERATIVE REPORT    DATE OF PROCEDURE:  11/08/2015       PREOPERATIVE DIAGNOSIS:   LEFT KNEE END STAGE OSTEOARTHRITS                                                       There is no weight on file to calculate BMI.     POSTOPERATIVE DIAGNOSIS:   LEFT KNEE END STAGE OSTEOARTHRITS                                                                     There is no weight on file to calculate BMI.     PROCEDURE:  Procedure(s):LEFT TOTAL KNEE ARTHROPLASTY      SURGEON:  Joni Fears, MD    ASSISTANT:   Biagio Borg, PA-C   (Present and scrubbed throughout the case, critical for assistance with exposure, retraction, instrumentation, and closure.)          ANESTHESIA: regional, spinal and IV sedation     DRAINS: (LEFT KNEE) Hemovact drain(s) in the CLAMPED with  Suction Clamped :      TOURNIQUET TIME:  Total Tourniquet Time Documented: Thigh (Left) - 76 minutes Total: Thigh (Left) - 76 minutes     COMPLICATIONS:  None   CONDITION:  stable  PROCEDURE IN [id number]    Joni Fears W 11/08/2015, 9:28 AM

## 2015-11-08 NOTE — Progress Notes (Signed)
Report given to caroline rn as caregiver 

## 2015-11-08 NOTE — Op Note (Signed)
NAMEKHRIZ, Jeff Greer              ACCOUNT NO.:  [id number]  MEDICAL RECORD NO.:  [id number]  LOCATION:  [facility]                         FACILITY:  [facility]  PHYSICIAN:  Vonna Kotyk. Orpah Hausner, M.D.DATE OF BIRTH:  14-Dec-1945  DATE OF PROCEDURE:  11/08/2015 DATE OF DISCHARGE:                              OPERATIVE REPORT   PREOPERATIVE DIAGNOSIS:  End-stage osteoarthritis, left knee.  POSTOPERATIVE DIAGNOSIS:  End-stage osteoarthritis, left knee.  PROCEDURE:  Left total knee arthroplasty.  SURGEON:  Vonna Kotyk. Durward Fortes, M.D.  ASSISTANT:  Aaron Edelman D. Petrarca, PA-C, was present throughout the operative procedure to ensure its timely completion.  ANESTHESIA:  Spinal with supplemental femoral nerve block and IV sedation.  COMPLICATIONS:  None.  COMPONENTS:  DePuy LCS large femoral component, a #5 rotating keeled tibial tray with a 10-mm polyethylene bridging bearing, 3-pegged metal back rotating patella, all was secured with polymethyl methacrylate.  DESCRIPTION OF PROCEDURE:  Mr. Brinegar was met with his family in the holding area, identified the left knee as appropriate operative site. Anesthesia performed a left femoral nerve block.  The patient was then transported to room #7.  Anesthesia performed spinal anesthetic.  Mr. Rushlow was then placed supine on the operating room table and under IV sedation, the left lower extremity was placed in a thigh tourniquet. The left leg was then prepped with chlorhexidine scrub and DuraPrep x2 from the tourniquet to the tips of the toes.  Sterile draping was performed.  Time-out was called.  With the extremity elevated, it was Esmarch exsanguinated with a proximal tourniquet at 350 mmHg.  A midline longitudinal incision was made, centered about the patella, extended from the superior pouch to tibial tubercle.  Via sharp dissection, incision was carried down to the subcutaneous tissue.  First layer of capsule was incised in the midline and medial  parapatellar incision was made with the Bovie.  Joint was entered.  There was a clear yellow joint effusion, approximately 15-20 mL in volume.  The patella was everted at 180 degrees laterally and the knee flexed to 90 degrees.  There was a fixed flexion and varus deformity.  I did a medial release.  There was moderate amount of beefy red synovitis, this was resected.  There was a large, thickened medial shelf plica that was also resected.  I measured a large femoral component.  First bony cut was made transversely on the proximal tibia with a 7- degree angle of declination using external tibial guide.  Subsequent cuts were made on the femur using the large femoral jig.  Flexion and extension gaps remained perfectly symmetrical at 10 mm.  The laminar spreaders were placed in the medial and lateral compartments, I removed medial and lateral menisci, ACL and PCL and large osteophytes in the posterior femoral condyles, using a 3/4-inch curved osteotome.  MCL and LCL remained intact throughout the procedure.  Final cuts were made on the femur with a 4-degree distal femoral valgus cut and then using the final tibial jig for the tapered cuts to obtain the center holes.  Retractors were then placed about the tibia, was advanced anteriorly, I measured a #5 tibial tray.  This was pinned in place.  We checked our  external alignment and then made a center hole followed by the keeled cut.  With the tibial jig in place, the 10-mm polyethylene bridging bearing was inserted followed by the large femoral component.  The entire construct was reduced, but we had excellent range of motion.  No malrotation of the tibial components and full extension.  Again, MCL and LCL remained intact.  The patella was prepared by removing 12 mm of bone leaving 14 mm of patellar thickness.  Three holes were made.  I lined the patellar clamp and then made the drill holes.  The trial patella was inserted and through  a full range of motion, after reduction was perfectly stable.  Trial components were removed.  Joint was copiously irrigated with saline solution.  The final components were then impacted.  I inserted the #5 tibial tray followed by the trial 10-mm polyethylene bridging bearing and the large femoral component.  Extraneous methacrylate was removed from the periphery of the components.  Patella was applied with a bone clamp and methacrylate.  At approximately 16 minutes of methacrylate had matured, during which time, we irrigated the joint and injected the deep capsule with 0.25% Marcaine with epinephrine.  When the methacrylate was matured, I removed the trial polyethylene component and removed some methacrylate from the posterior aspect of the tibia.  Tourniquet was deflated at approximately 16-76 minutes.  Gross bleeders were Bovie coagulated.  We then applied tranexamic acid to the joint topically for approximately 5 minutes and thought we had excellent hemostasis.  A medium-size Hemovac was inserted.  Deep capsule was closed with a running 0 Ethibond, superficial capsule with a running 2-0 Vicryl, subcu with 3-0 Monocryl, skin closed with skin clips.  Sterile bulky dressing was applied followed by the patient's support stocking.  The patient tolerated the procedure without complications.     Vonna Kotyk. Durward Fortes, M.D.     PWW/MEDQ  D:  11/08/2015  T:  11/08/2015  Job:  [id number]

## 2015-11-08 NOTE — Anesthesia Procedure Notes (Addendum)
Anesthesia Regional Block:  Femoral nerve block  Pre-Anesthetic Checklist: ,, timeout performed, Correct Patient, Correct Site, Correct Laterality, Correct Procedure, Correct Position, site marked, Risks and benefits discussed, pre-op evaluation,  At surgeon's request and post-op pain management  Laterality: Left  Prep: Maximum Sterile Barrier Precautions used and chloraprep       Needles:  Injection technique: Single-shot  Needle Type: Echogenic Stimulator Needle     Needle Length: 5cm 5 cm Needle Gauge: 22 and 22 G    Additional Needles:  Procedures: ultrasound guided (picture in chart) Femoral nerve block  Nerve Stimulator or Paresthesia:  Response: Patellar respose,   Additional Responses:   Narrative:  Start time: 11/08/2015 7:00 AM End time: 11/08/2015 7:10 AM Injection made incrementally with aspirations every 5 mL. Anesthesiologist: Roderic Palau  Additional Notes: 2% Lidocaine skin wheel.    Procedure Name: MAC Date/Time: 11/08/2015 7:32 AM Performed by: Jenne Campus Pre-anesthesia Checklist: Emergency Drugs available, Patient identified, Suction available, Patient being monitored and Timeout performed Patient Re-evaluated:Patient Re-evaluated prior to inductionOxygen Delivery Method: Simple face mask    Spinal Patient location during procedure: OR Start time: 11/08/2015 7:35 AM End time: 11/08/2015 7:42 AM Staffing Anesthesiologist: Roderic Palau Performed by: anesthesiologist  Preanesthetic Checklist Completed: patient identified, surgical consent, pre-op evaluation, timeout performed, IV checked, risks and benefits discussed and monitors and equipment checked Spinal Block Patient position: sitting Prep: DuraPrep Patient monitoring: cardiac monitor, continuous pulse ox and blood pressure Approach: midline Location: L3-4 Injection technique: single-shot Needle Needle type: Pencan  Needle gauge: 24 G Needle length: 9  cm Assessment Sensory level: T8 Additional Notes Functioning IV was confirmed and monitors were applied. Sterile prep and drape, including hand hygiene and sterile gloves were used. The patient was positioned and the spine was prepped. The skin was anesthetized with lidocaine.  Free flow of clear CSF was obtained prior to injecting local anesthetic into the CSF.  The spinal needle aspirated freely following injection.  The needle was carefully withdrawn.  The patient tolerated the procedure well.

## 2015-11-08 NOTE — Progress Notes (Signed)
Orthopedic Tech Progress Note Patient Details:  Jeff Greer 01-04-1946 [id number] On cpm at 1920 Patient ID: [name], male   DOB: 1946/09/15, 69 y.o.   MRN: [id number]   [name] 11/08/2015, 7:21 PM

## 2015-11-08 NOTE — Anesthesia Postprocedure Evaluation (Signed)
Anesthesia Post Note  Patient: Jeff Greer  Procedure(s) Performed: Procedure(s) (LRB): TOTAL KNEE ARTHROPLASTY (Left)  Patient location during evaluation: PACU Anesthesia Type: Spinal and MAC Level of consciousness: awake and alert Pain management: pain level controlled Vital Signs Assessment: post-procedure vital signs reviewed and stable Respiratory status: spontaneous breathing and respiratory function stable Cardiovascular status: blood pressure returned to baseline and stable Postop Assessment: spinal receding Anesthetic complications: no    Last Vitals:  Filed Vitals:   11/08/15 1100 11/08/15 1115  BP: 126/53 126/73  Pulse: 54 53  Temp:  36.5 C  Resp: 17 14    Last Pain:  Filed Vitals:   11/08/15 1132  PainSc: 0-No pain                 Heliodoro Domagalski,W. EDMOND

## 2015-11-08 NOTE — Progress Notes (Signed)
Utilization review completed. Declyn Delsol, RN, BSN. 

## 2015-11-09 ENCOUNTER — Encounter (HOSPITAL_COMMUNITY): Payer: Self-pay | Admitting: Orthopaedic Surgery

## 2015-11-09 LAB — CBC
HCT: 36.4 % — ABNORMAL LOW (ref 39.0–52.0)
Hemoglobin: 12.1 g/dL — ABNORMAL LOW (ref 13.0–17.0)
MCH: 31.7 pg (ref 26.0–34.0)
MCHC: 33.2 g/dL (ref 30.0–36.0)
MCV: 95.3 fL (ref 78.0–100.0)
Platelets: 166 10*3/uL (ref 150–400)
RBC: 3.82 MIL/uL — ABNORMAL LOW (ref 4.22–5.81)
RDW: 12.6 % (ref 11.5–15.5)
WBC: 7.6 10*3/uL (ref 4.0–10.5)

## 2015-11-09 LAB — BASIC METABOLIC PANEL
Anion gap: 6 (ref 5–15)
BUN: 9 mg/dL (ref 6–20)
CALCIUM: 8.4 mg/dL — AB (ref 8.9–10.3)
CHLORIDE: 107 mmol/L (ref 101–111)
CO2: 27 mmol/L (ref 22–32)
CREATININE: 0.81 mg/dL (ref 0.61–1.24)
GFR calc non Af Amer: >60 mL/min (ref >60)
Glucose, Bld: 97 mg/dL (ref 65–99)
Potassium: 4 mmol/L (ref 3.5–5.1)
SODIUM: 140 mmol/L (ref 135–145)

## 2015-11-09 NOTE — Care Management Note (Signed)
Case Management Note  Patient Details  Name: Jeff Greer MRN: [id number] Date of Birth: 1946-03-19  Subjective/Objective:       S/p left total knee arthroplasty             Action/Plan: Set up with [facility] for HHPT by MD office. Spoke with patient and his daughter, no change in discharge plan. Patient will be staying with his daughter at her home [address], [place], [place]. [employer] will deliver CPM to daughter's address,patient stated that he has a rolling walker and a 3N1. Patient's daughter will be bale to assist patient after discharge.     Expected Discharge Date:                  Expected Discharge Plan:  [place]  In-House Referral:  NA  Discharge planning Services  CM Consult  Post Acute Care Choice:  Home Health, Durable Medical Equipment Choice offered to:  Patient, Adult Children  DME Arranged:  CPM DME Agency:  [employer]  HH Arranged:  PT HH Agency:  [facility]  Status of Service:  Completed, signed off  Medicare Important Message Given:    Date Medicare IM Given:    Medicare IM give by:    Date Additional Medicare IM Given:    Additional Medicare Important Message give by:     If discussed at [place] of Stay Meetings, dates discussed:    Additional Comments:  Nila Nephew, RN 11/09/2015, 3:18 PM

## 2015-11-09 NOTE — Evaluation (Signed)
Occupational Therapy Evaluation Patient Details Name: Jeff Greer MRN: [id number] DOB: 04-01-1946 Today's Date: 11/09/2015    History of Present Illness 69 yo male s/p Left total knee arthroplasty   Clinical Impression   Patient admitted with above. Patient independent PTA. Patient currently functioning at an overall supervision level. Pt plans to discharge to daughters house with assistance provided from her and her family.  No additional OT needs identified, D/C from acute OT services and no additional follow-up OT needs at this time. All appropriate education provided to patient and family (daughter). Please re-order OT if needed.      Follow Up Recommendations  No OT follow up;Supervision/Assistance - 24 hour    Equipment Recommendations  None recommended by OT    Recommendations for Other Services  None at this time   Precautions / Restrictions Precautions Precautions: Fall Restrictions Weight Bearing Restrictions: Yes LLE Weight Bearing: Partial weight bearing LLE Partial Weight Bearing Percentage or Pounds: 50    Mobility Bed Mobility General bed mobility comments: Pt received seated in recliner upon OT entering/exiting room  Transfers Overall transfer level: Needs assistance Equipment used: Rolling walker (2 wheeled) Transfers: Sit to/from Stand Sit to Stand: Supervision General transfer comment: Cues for hand placement and technique     Balance Overall balance assessment: Needs assistance Sitting-balance support: No upper extremity supported;Feet supported Sitting balance-Leahy Scale: Good     Standing balance support: Bilateral upper extremity supported;During functional activity Standing balance-Leahy Scale: Fair    ADL Overall ADL's : Needs assistance/impaired Eating/Feeding: Set up;Sitting   Grooming: Set up;Sitting   Upper Body Bathing: Set up;Sitting   Lower Body Bathing: Supervison/ safety;Sit to/from stand   Upper Body Dressing : Set  up;Sitting   Lower Body Dressing: Supervision/safety;Sit to/from stand   Toilet Transfer: Supervision/safety;RW;BSC;Ambulation           Functional mobility during ADLs: Supervision/safety;Rolling walker General ADL Comments: Pt able to easily reach LLE for LB ADLs. Pt ambulated to/from BR using RW while adhering to PWB. Educated patient and daughter on use of 3-n-1 in tub/shower unit and handout administerred. Pt does not need any further OT and this time.    Pertinent Vitals/Pain Pain Assessment: Faces Pain Score: 1  Faces Pain Scale: Hurts a little bit Pain Location: left knee  Pain Descriptors / Indicators: Aching;Sore Pain Intervention(s): Monitored during session;Repositioned;Ice applied     Hand Dominance Right   Extremity/Trunk Assessment Upper Extremity Assessment Upper Extremity Assessment: Overall WFL for tasks assessed   Lower Extremity Assessment Lower Extremity Assessment: Defer to PT evaluation LLE Deficits / Details: decreased strength and ROM as expected post op   Cervical / Trunk Assessment Cervical / Trunk Assessment: Normal   Communication Communication Communication: No difficulties   Cognition Arousal/Alertness: Awake/alert Behavior During Therapy: WFL for tasks assessed/performed Overall Cognitive Status: Within Functional Limits for tasks assessed              Home Living Family/patient expects to be discharged to:: Private residence Living Arrangements: Alone Available Help at Discharge: Family;Available 24 hours/day (daughter and her family) Type of Home: House Home Access: Stairs to enter [employer] of Steps: 2+1 Entrance Stairs-Rails: None Home Layout: One level     Bathroom Shower/Tub: Tub/shower unit;Curtain   Biochemist, clinical: Standard     Home Equipment: Crutches;Walker - 2 wheels;Bedside commode;Shower seat    Prior Functioning/Environment Level of Independence: Independent     OT Diagnosis: Generalized  weakness;Acute pain   OT Problem List:   N/a,  no acute OT needs identified at this time     OT Treatment/Interventions:   N/a, no acute OT needs identified at this time     OT Goals(Current goals can be found in the care plan section) Acute Rehab OT Goals Patient Stated Goal: none stated OT Goal Formulation: All assessment and education complete, DC therapy  OT Frequency:   N/a, no acute OT needs identified at this time     Barriers to D/C:  None known at this time    End of Session Equipment Utilized During Treatment: Rolling walker CPM Left Knee CPM Left Knee: Off  Activity Tolerance: Patient tolerated treatment well Patient left: in chair;with call bell/phone within reach;with family/visitor present;with SCD's reapplied   Time: [id number] OT Time Calculation (min): 16 min Charges:  OT General Charges $OT Visit: 1 Procedure OT Evaluation $Initial OT Evaluation Tier I: 1 Procedure  Chrys Racer , MS, OTR/L, CLT Pager: [phone]  11/09/2015, 11:44 AM

## 2015-11-09 NOTE — Discharge Instructions (Signed)
Information on my medicine - XARELTO (Rivaroxaban)  This medication education was reviewed with me or my healthcare representative as part of my discharge preparation.  The pharmacist spoke with me during my hospital stay. Why was Xarelto prescribed for you? Xarelto was prescribed for you to reduce the risk of blood clots forming after orthopedic surgery. The medical term for these abnormal blood clots is venous thromboembolism (VTE).  What do you need to know about xarelto ? Take your Xarelto ONCE DAILY at the same time every day. You may take it either with or without food.  If you have difficulty swallowing the tablet whole, you may crush it and mix in applesauce just prior to taking your dose.  Take Xarelto exactly as prescribed by your doctor and DO NOT stop taking Xarelto without talking to the doctor who prescribed the medication.  Stopping without other VTE prevention medication to take the place of Xarelto may increase your risk of developing a clot.  After discharge, you should have regular check-up appointments with your healthcare provider that is prescribing your Xarelto.    What do you do if you miss a dose? If you miss a dose, take it as soon as you remember on the same day then continue your regularly scheduled once daily regimen the next day. Do not take two doses of Xarelto on the same day.   Important Safety Information A possible side effect of Xarelto is bleeding. You should call your healthcare provider right away if you experience any of the following: ? Bleeding from an injury or your nose that does not stop. ? Unusual colored urine (red or dark brown) or unusual colored stools (red or black). ? Unusual bruising for unknown reasons. ? A serious fall or if you hit your head (even if there is no bleeding).  Some medicines may interact with Xarelto and might increase your risk of bleeding while on Xarelto. To help avoid this, consult your healthcare  provider or pharmacist prior to using any new prescription or non-prescription medications, including herbals, vitamins, non-steroidal anti-inflammatory drugs (NSAIDs) and supplements.  This website has more information on Xarelto: [email or web address]

## 2015-11-09 NOTE — Progress Notes (Signed)
Physical Therapy Treatment Patient Details Name: Jeff Greer MRN: [id number] DOB: 08/15/1946 Today's Date: 11/09/2015    History of Present Illness 69 yo male s/p Left total knee arthroplasty with history of Parkinson's disease    PT Comments    Continues to make steady progress, ambulating up to 100 feet this afternoon, using a rolling walker to maintain 50% weight-bearing status on LLE. Tolerating exercises well. Placed back in CPM at 1623. To address stair navigation tomorrow which I anticipate he will perform well. Patient will continue to benefit from skilled physical therapy services to further improve independence with functional mobility.   Follow Up Recommendations  Home health PT;Supervision for mobility/OOB     Equipment Recommendations  None recommended by PT    Recommendations for Other Services       Precautions / Restrictions Precautions Precautions: Fall;Knee Precaution Comments: reviewed handout Restrictions Weight Bearing Restrictions: Yes LLE Weight Bearing: Partial weight bearing LLE Partial Weight Bearing Percentage or Pounds: 50    Mobility  Bed Mobility Overal bed mobility: Needs Assistance Bed Mobility: Supine to Sit     Supine to sit: Supervision     General bed mobility comments: supervision for safety. cues for technique  Transfers Overall transfer level: Needs assistance Equipment used: Rolling walker (2 wheeled) Transfers: Sit to/from Stand Sit to Stand: Min guard         General transfer comment: min guard for safety. Reminded of WB precautions which he safely maintained. Cues for hand placement  Ambulation/Gait Ambulation/Gait assistance: Min guard Ambulation Distance (Feet): 100 Feet Assistive device: Rolling walker (2 wheeled) Gait Pattern/deviations: Step-to pattern;Step-through pattern;Decreased step length - right;Decreased stance time - left;Antalgic;Trunk flexed Gait velocity: decreased Gait velocity  interpretation: Below normal speed for age/gender General Gait Details: Progressing with step-through gait pattern. Cues for technique, and to maintain 50% weight-bearing. Good RW control. VC for forward gaze. Required 1 standing rest break to complete due to UE fatigue.   Stairs            Wheelchair Mobility    Modified Rankin (Stroke Patients Only)       Balance                                    Cognition Arousal/Alertness: Awake/alert Behavior During Therapy: WFL for tasks assessed/performed Overall Cognitive Status: Within Functional Limits for tasks assessed                      Exercises Total Joint Exercises Ankle Circles/Pumps: AROM;Both;10 reps;Seated Quad Sets: Strengthening;Both;10 reps;Seated Heel Slides: AAROM;Both;10 reps;Supine Hip ABduction/ADduction: AAROM;Both;10 reps;Supine Long Arc Quad: Strengthening;Left;10 reps;Seated Knee Flexion: AAROM;Left;10 reps;Seated Goniometric ROM: 19-91 degrees left knee flexion    General Comments        Pertinent Vitals/Pain Pain Assessment: Faces Faces Pain Scale: Hurts a little bit Pain Location: Lt knee Pain Descriptors / Indicators: Aching Pain Intervention(s): Monitored during session;Premedicated before session;Repositioned    Home Living                      Prior Function            PT Goals (current goals can now be found in the care plan section) Acute Rehab PT Goals Patient Stated Goal: Go home soon PT Goal Formulation: With patient Time For Goal Achievement: 11/16/15 Potential to Achieve Goals: Good Progress towards PT goals: Progressing toward goals  Frequency  7X/week    PT Plan Current plan remains appropriate    Co-evaluation             End of Session Equipment Utilized During Treatment: Gait belt Activity Tolerance: Patient tolerated treatment well Patient left: with call bell/phone within reach;with SCD's reapplied;in bed;with  family/visitor present (in CPM)     [id number] PT Time Calculation (min) (ACUTE ONLY): 17 min  Charges:  $Gait Training: 8-22 mins                    G Codes:      Ellouise Newer 12/09/15, 4:53 PM  Camille Bal [place], [place]

## 2015-11-09 NOTE — Progress Notes (Signed)
Patient ID: Jeff Greer, male   DOB: Jun 02, 1946, 69 y.o.   MRN: [id number] PATIENT ID: Jeff Greer        MRN:  [id number]          DOB/AGE: [birth year] / 69 y.o.    Joni Fears, MD   Biagio Borg, PA-C [address] [place], [place]  [address]                             [phone]   PROGRESS NOTE  Subjective:  negative for Chest Pain  negative for Shortness of Breath  negative for Nausea/Vomiting   negative for Calf Pain    Tolerating Diet: yes         Patient reports pain as mild.     comfortable  Objective: Vital signs in last 24 hours:   Patient Vitals for the past 24 hrs:  BP Temp Temp src Pulse Resp SpO2 Height Weight  11/09/15 0100 - - - - - - 5\' 10"  (1.778 m) 84 kg (185 lb 3 oz)  11/09/15 0019 126/61 mmHg - - 67 16 96 % - -  11/08/15 2129 (!) 141/65 mmHg 97.9 F (36.6 C) Oral 71 16 96 % - -  11/08/15 1300 (!) 154/81 mmHg 97.5 F (36.4 C) - 65 16 98 % - -  11/08/15 1115 - 97.7 F (36.5 C) - (!) 53 14 97 % - -  11/08/15 1100 (!) 126/53 mmHg - - (!) 54 17 96 % - -  11/08/15 1045 119/62 mmHg - - 60 15 94 % - -  11/08/15 1030 114/72 mmHg - - (!) 57 16 96 % - -  11/08/15 1015 118/67 mmHg - - (!) 54 15 97 % - -  11/08/15 1000 99/75 mmHg - - 63 15 95 % - -  11/08/15 0949 (!) 81/39 mmHg 97.8 F (36.6 C) - 78 17 92 % - -      Intake/Output from previous day:   12/20 0701 - 12/21 0700 In: 2411.3 [P.O.:240; I.V.:1821.3] Out: 1100 [Urine:500; Drains:600]   Intake/Output this shift:       Intake/Output      12/20 0701 - 12/21 0700 12/21 0701 - 12/22 0700   P.O. 240    I.V. (mL/kg) 1821.3 (21.7)    IV Piggyback 350    Total Intake(mL/kg) 2411.3 (28.7)    Urine (mL/kg/hr) 500 (0.2)    Drains 600 (0.3)    Total Output 1100     Net +1311.3          Urine Occurrence 2 x       LABORATORY DATA:  Recent Labs  11/02/15 1615 11/09/15 0534  WBC 7.3 7.6  HGB 14.6 12.1*  HCT 43.9 36.4*  PLT 215 166    Recent Labs  11/02/15 1615  11/09/15 0534  NA 140 140  K 3.9 4.0  CL 106 107  CO2 27 27  BUN 13 9  CREATININE 0.80 0.81  GLUCOSE 95 97  CALCIUM 9.1 8.4*   Lab Results  Component Value Date   INR 1.07 11/02/2015    Recent Radiographic Studies :  Dg Chest 2 View  11/02/2015  CLINICAL DATA:  Hypertension.  COPD. EXAM: CHEST  2 VIEW COMPARISON:  12/09/2002. FINDINGS: Mediastinum and hilar structures are normal. No focal infiltrate. COPD. Biapical pleural parenchymal thickening noted most consistent with scarring. Prominent linear scar is in the right upper lobe. No pleural  effusion pneumothorax. Heart size normal. Prominence sliding hiatal hernia. No acute bony abnormality. IMPRESSION: 1. COPD and bilateral pleural-parenchymal scarring. 2. Prominent sliding hiatal hernia . Electronically Signed   By: [place]   On: 11/02/2015 16:52     Examination:  General appearance: alert, cooperative and no distress  Wound Exam: clean, dry, intact   Drainage:  Scant/small amount Serosanguinous exudate in hemovac  Motor Exam: EHL, FHL, Anterior Tibial and Posterior Tibial Intact  Sensory Exam: Superficial Peroneal, Deep Peroneal and Tibial normal  Vascular Exam: Normal  Assessment:    1 Day Post-Op  Procedure(s) (LRB): TOTAL KNEE ARTHROPLASTY (Left)  ADDITIONAL DIAGNOSIS:  Principal Problem:   Primary osteoarthritis of left knee Active Problems:   Parkinson disease, symptomatic (HCC)   Hypertension   S/P total knee replacement using cement  no new problems   Plan: Physical Therapy as ordered Partial Weight Bearing @ 50% (PWB)  DVT Prophylaxis:  Xarelto, Foot Pumps and TED hose  DISCHARGE PLAN: Home  DISCHARGE NEEDS: HHPT, CPM, Walker and 3-in-1 comode seat   comfortable at present,...OOB with PT, D/C hemovac, saline lock IV, hope for D/C tomorrow     Garald Balding  11/09/2015 7:48 AM

## 2015-11-09 NOTE — Evaluation (Signed)
Physical Therapy Evaluation Patient Details Name: Jeff Greer MRN: [id number] DOB: [birth year] Today's Date: 11/09/2015   History of Present Illness  69 yo male s/p Left total knee arthroplasty  Clinical Impression  Pt is s/p left TKA resulting in the deficits listed below (see PT Problem List). Ambulating up to 80 feet this AM with min guard for safety, maintaining 50% weight-bearing on Lt at all times. No overt loss of balance. Reviewed therapeutic exercises and precautions. Daughter to stay with pt at d/c. She was present and very supportive. Pt will benefit from skilled PT to increase their independence and safety with mobility to allow discharge to the venue listed below.       Follow Up Recommendations Home health PT;Supervision for mobility/OOB    Equipment Recommendations  None recommended by PT    Recommendations for Other Services       Precautions / Restrictions Precautions Precautions: Fall Precaution Booklet Issued: Yes (comment) Precaution Comments: reviewed handout Restrictions Weight Bearing Restrictions: Yes LLE Weight Bearing: Partial weight bearing LLE Partial Weight Bearing Percentage or Pounds: 50      Mobility  Bed Mobility Overal bed mobility: Needs Assistance Bed Mobility: Supine to Sit     Supine to sit: Supervision     General bed mobility comments: Pt received seated in recliner upon OT entering/exiting room  Transfers Overall transfer level: Needs assistance Equipment used: Rolling walker (2 wheeled) Transfers: Sit to/from Stand Sit to Stand: Supervision         General transfer comment: Cues for hand placement and technique   Ambulation/Gait Ambulation/Gait assistance: Min guard Ambulation Distance (Feet): 80 Feet Assistive device: Rolling walker (2 wheeled) Gait Pattern/deviations: Step-to pattern;Decreased step length - right;Decreased stance time - left;Antalgic;Trunk flexed Gait velocity: decreased Gait velocity  interpretation: Below normal speed for age/gender General Gait Details: Educated on safe DME use with a rolling walker. VC for sequencing, weight-bearing precautions, and upright posture with forward gaze. Safely maintains precautions, no overt loss of balance noted.  Stairs            Wheelchair Mobility    Modified Rankin (Stroke Patients Only)       Balance Overall balance assessment: Needs assistance Sitting-balance support: No upper extremity supported;Feet supported Sitting balance-Leahy Scale: Good     Standing balance support: Bilateral upper extremity supported;During functional activity Standing balance-Leahy Scale: Fair                               Pertinent Vitals/Pain Pain Assessment: Faces Pain Score: 1  Faces Pain Scale: Hurts a little bit Pain Location: left knee  Pain Descriptors / Indicators: Aching;Sore Pain Intervention(s): Monitored during session;Repositioned;Ice applied    Home Living Family/patient expects to be discharged to:: Private residence Living Arrangements: Alone Available Help at Discharge: Family;Available 24 hours/day (daughter and her family) Type of Home: House Home Access: Stairs to enter Entrance Stairs-Rails: None Entrance Stairs-Number of Steps: 2+1 Home Layout: One level Home Equipment: Crutches;Walker - 2 wheels;Bedside commode;Shower seat      Prior Function Level of Independence: Independent               Hand Dominance   Dominant Hand: Right    Extremity/Trunk Assessment   Upper Extremity Assessment: Overall WFL for tasks assessed           Lower Extremity Assessment: Defer to PT evaluation   LLE Deficits / Details: decreased strength and ROM as expected  post op  Cervical / Trunk Assessment: Normal  Communication   Communication: No difficulties  Cognition Arousal/Alertness: Awake/alert Behavior During Therapy: WFL for tasks assessed/performed Overall Cognitive Status: Within  Functional Limits for tasks assessed                      General Comments General comments (skin integrity, edema, etc.): Reviewed knee precautions and weight-bearing status.    Exercises Total Joint Exercises Ankle Circles/Pumps: AROM;Both;10 reps;Seated Quad Sets: Strengthening;Both;10 reps;Seated Long Arc Quad: Strengthening;Left;10 reps;Seated Knee Flexion: AAROM;Left;10 reps;Seated      Assessment/Plan    PT Assessment Patient needs continued PT services  PT Diagnosis Difficulty walking;Abnormality of gait;Acute pain   PT Problem List Decreased strength;Decreased range of motion;Decreased activity tolerance;Decreased balance;Decreased mobility;Decreased knowledge of use of DME;Decreased knowledge of precautions;Pain  PT Treatment Interventions Stair training;Gait training;DME instruction;Functional mobility training;Therapeutic activities;Therapeutic exercise;Balance training;Neuromuscular re-education;Patient/family education;Modalities   PT Goals (Current goals can be found in the Care Plan section) Acute Rehab PT Goals Patient Stated Goal: none stated PT Goal Formulation: With patient Time For Goal Achievement: 11/16/15 Potential to Achieve Goals: Good    Frequency 7X/week   Barriers to discharge        Co-evaluation               End of Session Equipment Utilized During Treatment: Gait belt Activity Tolerance: Patient tolerated treatment well Patient left: in chair;with call bell/phone within reach;with family/visitor present;with SCD's reapplied Nurse Communication: Mobility status         Time: [id number] PT Time Calculation (min) (ACUTE ONLY): 22 min   Charges:   PT Evaluation $Initial PT Evaluation Tier I: 1 Procedure     PT G CodesEllouise Newer 11/09/2015, 11:48 AM

## 2015-11-10 LAB — BASIC METABOLIC PANEL
Anion gap: 7 (ref 5–15)
BUN: 9 mg/dL (ref 6–20)
CO2: 24 mmol/L (ref 22–32)
Calcium: 8.4 mg/dL — ABNORMAL LOW (ref 8.9–10.3)
Chloride: 107 mmol/L (ref 101–111)
Creatinine, Ser: 0.68 mg/dL (ref 0.61–1.24)
GFR calc Af Amer: >60 mL/min (ref >60)
GLUCOSE: 101 mg/dL — AB (ref 65–99)
POTASSIUM: 3.7 mmol/L (ref 3.5–5.1)
Sodium: 138 mmol/L (ref 135–145)

## 2015-11-10 LAB — CBC
HEMATOCRIT: 33.6 % — AB (ref 39.0–52.0)
Hemoglobin: 11.3 g/dL — ABNORMAL LOW (ref 13.0–17.0)
MCH: 31.7 pg (ref 26.0–34.0)
MCHC: 33.6 g/dL (ref 30.0–36.0)
MCV: 94.4 fL (ref 78.0–100.0)
PLATELETS: 174 10*3/uL (ref 150–400)
RBC: 3.56 MIL/uL — AB (ref 4.22–5.81)
RDW: 12.6 % (ref 11.5–15.5)
WBC: 8.7 10*3/uL (ref 4.0–10.5)

## 2015-11-10 MED ORDER — OXYCODONE HCL 5 MG PO TABS: 5.0000 mg | ORAL_TABLET | ORAL | Status: DC | PRN

## 2015-11-10 MED ORDER — RIVAROXABAN 10 MG PO TABS: 10.0000 mg | ORAL_TABLET | Freq: Every day | ORAL | Status: DC

## 2015-11-10 MED ORDER — METHOCARBAMOL 500 MG PO TABS: 500.0000 mg | ORAL_TABLET | Freq: Three times a day (TID) | ORAL | Status: DC | PRN

## 2015-11-10 NOTE — Discharge Summary (Signed)
Joni Fears, MD   Biagio Borg, PA-C [address] [place], [place]  [address]                             [phone]  PATIENT ID: Jeff Greer        MRN:  [id number]          DOB/AGE: 67-Nov-1947 / 67 y.o.    DISCHARGE SUMMARY  ADMISSION DATE:    11/08/2015 DISCHARGE DATE:   11/10/2015   ADMISSION DIAGNOSIS: LEFT KNEE END STAGE OSTEOARTHRITS    DISCHARGE DIAGNOSIS:  LEFT KNEE END STAGE OSTEOARTHRITS    ADDITIONAL DIAGNOSIS: Principal Problem:   Primary osteoarthritis of left knee Active Problems:   Parkinson disease, symptomatic ([place])   Hypertension   S/P total knee replacement using cement  Past Medical History  Diagnosis Date  . Hypertension   . Emphysema lung ([place])   . GERD (gastroesophageal reflux disease)   . History of hiatal hernia   . Arthritis   . Parkinson's disease ([place])     PROCEDURE: Procedure(s): TOTAL KNEE ARTHROPLASTY Left on 11/08/2015  CONSULTS: none     HISTORY: Jeff Greer is a very pleasant 69 year old white male who is seen today for evaluation of his left knee. He has had problems with his left knee with an acute onset of pain and discomfort in April 2016. He did not have any history of injury or trauma at that time, but he is a logger and works with heavy equipment, but did not remember a particular incident that caused his pain. He had a lot of popping sensation and his knee was feeling as if it was giving way. He had one incident where the knee pain caused him to fall at that time. He did have a corticosteroid injection done on May 9, but apparently it did not make much of a difference at that time. He has iced it, used Ben-Gay and actually taken hydrocodone. He continued to have pain and discomfort in the knee. Was seen in our practice on March 07, 2015. An MRI scan was ordered, which showed a large complex tear in the posterior horn and posterior body of the medial meniscus with a horizontal component reaching the  meniscal surface and a large radial component. A meniscal fragment was displaced along the medial femoral condyle, according to the MRI scan. Lateral meniscus did have a large radial tear on MRI also. There was some thinning and irregularity of the medial compartment noted at that time. Arthroscopic surgery was performed on Apr 14, 2015, encompassing a left knee partial medial and lateral meniscectomy with chondroplasty of the medial femoral condyle. He did have grade IV changes on the medial femoral condyle. He was not having any swelling and his significant problem was, though, that when he got from a sitting position, it took him 3 or 4 steps before he felt better. His last visit was in June 2016 for his knee arthroscopy. He returned back on September 28, 2015, with recurrent pain and a corticosteroid injection was given. When he followed back up, he was noted to have continued pain.  HOSPITAL COURSE:  Jeff Greer is a 69 y.o. admitted on 11/08/2015 and found to have a diagnosis of LEFT KNEE END STAGE OSTEOARTHRITS.  After appropriate laboratory studies were obtained  they were taken to the operating room on 11/08/2015 and underwent  Procedure(s): TOTAL KNEE ARTHROPLASTY  Left.   They were  given perioperative antibiotics:  Anti-infectives    Start     Dose/Rate Route Frequency Ordered Stop   11/08/15 1930  ceFAZolin (ANCEF) IVPB 2 g/50 mL premix     2 g 100 mL/hr over 30 Minutes Intravenous Every 6 hours 11/08/15 1757 11/09/15 0205   11/08/15 0700  ceFAZolin (ANCEF) IVPB 2 g/50 mL premix     2 g 100 mL/hr over 30 Minutes Intravenous To ShortStay Surgical 11/07/15 1206 11/08/15 0740    .  Tolerated the procedure well.  Toradol was given post op.  POD #1, allowed out of bed to a chair.  PT for ambulation and exercise program.   IV saline locked.  O2 discontionued. Hemovac pulled.  POD #2, continued PT and ambulation.  Did well with PT . The remainder of the hospital course was  dedicated to ambulation and strengthening.   The patient was discharged on 2 Days Post-Op in  Stable condition.  Blood products given:none  DIAGNOSTIC STUDIES: Recent vital signs: Patient Vitals for the past 24 hrs:  BP Temp Temp src Pulse Resp SpO2  11/10/15 0625 132/66 mmHg 98.5 F (36.9 C) Oral 81 16 95 %  11/09/15 2115 (!) 128/51 mmHg 98.3 F (36.8 C) Oral 81 16 94 %  11/09/15 1341 (!) 174/98 mmHg 98.8 F (37.1 C) - 80 18 100 %       Recent laboratory studies:  Recent Labs  11/09/15 0534 11/10/15 0610  WBC 7.6 8.7  HGB 12.1* 11.3*  HCT 36.4* 33.6*  PLT 166 174    Recent Labs  11/09/15 0534 11/10/15 0610  NA 140 138  K 4.0 3.7  CL 107 107  CO2 27 24  BUN 9 9  CREATININE 0.81 0.68  GLUCOSE 97 101*  CALCIUM 8.4* 8.4*   Lab Results  Component Value Date   INR 1.07 11/02/2015     Recent Radiographic Studies :  Dg Chest 2 View  11/02/2015  CLINICAL DATA:  Hypertension.  COPD. EXAM: CHEST  2 VIEW COMPARISON:  12/09/2002. FINDINGS: Mediastinum and hilar structures are normal. No focal infiltrate. COPD. Biapical pleural parenchymal thickening noted most consistent with scarring. Prominent linear scar is in the right upper lobe. No pleural effusion pneumothorax. Heart size normal. Prominence sliding hiatal hernia. No acute bony abnormality. IMPRESSION: 1. COPD and bilateral pleural-parenchymal scarring. 2. Prominent sliding hiatal hernia . Electronically Signed   By: Marcello Moores  Register   On: 11/02/2015 16:52    DISCHARGE INSTRUCTIONS: Discharge Instructions    CPM    Complete by:  As directed   Continuous passive motion machine (CPM):      Use the CPM from 0 to 60 degrees for 6-8 hours per day.      You may increase by 5-10 per day.  You may break it up into 2 or 3 sessions per day.      Use CPM for 3-4 weeks or until you are told to stop.     Call MD / Call 911    Complete by:  As directed   If you experience chest pain or shortness of breath, CALL 911 and be  transported to the hospital emergency room.  If you develope a fever above 101 F, pus (white drainage) or increased drainage or redness at the wound, or calf pain, call your surgeon's office.     Change dressing    Complete by:  As directed   DO NOT CHANGE YOUR DRESSING.     Constipation Prevention  Complete by:  As directed   Drink plenty of fluids.  Prune juice may be helpful.  You may use a stool softener, such as Colace (over the counter) 100 mg twice a day.  Use MiraLax (over the counter) for constipation as needed.     Diet general    Complete by:  As directed      Discharge instructions    Complete by:  As directed   [place] items at home which could result in a fall. This includes throw rugs or furniture in walking pathways ICE to the affected joint every three hours while awake for 30 minutes at a time, for at least the first 3-5 days, and then as needed for pain and swelling.  Continue to use ice for pain and swelling. You may notice swelling that will progress down to the foot and ankle.  This is normal after surgery.  Elevate your leg when you are not up walking on it.   Continue to use the breathing machine you got in the hospital (incentive spirometer) which will help keep your temperature down.  It is common for your temperature to cycle up and down following surgery, especially at night when you are not up moving around and exerting yourself.  The breathing machine keeps your lungs expanded and your temperature down.   DIET:  As you were doing prior to hospitalization, we recommend a well-balanced diet.  DRESSING / WOUND CARE / SHOWERING  Keep the surgical dressing until follow up.  The dressing is water proof, so you can shower without any extra covering.  IF THE DRESSING FALLS OFF or the wound gets wet inside, change the dressing with sterile gauze.  Please use good hand washing techniques before changing the dressing.  Do not use any  lotions or creams on the incision until instructed by your surgeon.    ACTIVITY  Increase activity slowly as tolerated, but follow the weight bearing instructions below.   No driving for 6 weeks or until further direction given by your physician.  You cannot drive while taking narcotics.  No lifting or carrying greater than 10 lbs. until further directed by your surgeon. Avoid periods of inactivity such as sitting longer than an hour when not asleep. This helps prevent blood clots.  You may return to work once you are authorized by your doctor.     WEIGHT BEARING   Partial weight bearing with assist device as directed.  50% weight bearing as taught in PT   EXERCISES  Results after joint replacement surgery are often greatly improved when you follow the exercise, range of motion and muscle strengthening exercises prescribed by your doctor. Safety measures are also important to protect the joint from further injury. Any time any of these exercises cause you to have increased pain or swelling, decrease what you are doing until you are comfortable again and then slowly increase them. If you have problems or questions, call your caregiver or physical therapist for advice.   Rehabilitation is important following a joint replacement. After just a few days of immobilization, the muscles of the leg can become weakened and shrink (atrophy).  These exercises are designed to build up the tone and strength of the thigh and leg muscles and to improve motion. Often times heat used for twenty to thirty minutes before working out will loosen up your tissues and help with improving the range of motion but do not use heat for the first  two weeks following surgery (sometimes heat can increase post-operative swelling).   These exercises can be done on a training (exercise) mat,  on a table or on a bed. Use whatever works the best and is most comfortable for you.    Use music or television while you are exercising  so that the exercises are a pleasant break in your day. This will make your life better with the exercises acting as a break in your routine that you can look forward to.   Perform all exercises about fifteen times, three times per day or as directed.  You should exercise both the operative leg and the other leg as well.   Exercises include:  Quad Sets - Tighten up the muscle on the front of the thigh (Quad) and hold for 5-10 seconds.   Straight Leg Raises - With your knee straight (if you were given a brace, keep it on), lift the leg to 60 degrees, hold for 3 seconds, and slowly lower the leg.  Perform this exercise against resistance later as your leg gets stronger.  Leg Slides: Lying on your back, slowly slide your foot toward your buttocks, bending your knee up off the floor (only go as far as is comfortable). Then slowly slide your foot back down until your leg is flat on the floor again.  Angel Wings: Lying on your back spread your legs to the side as far apart as you can without causing discomfort.  Hamstring Strength:  Lying on your back, push your heel against the floor with your leg straight by tightening up the muscles of your buttocks.  Repeat, but this time bend your knee to a comfortable angle, and push your heel against the floor.  You may put a pillow under the heel to make it more comfortable if necessary.   A rehabilitation program following joint replacement surgery can speed recovery and prevent re-injury in the future due to weakened muscles. Contact your doctor or a physical therapist for more information on knee rehabilitation.    CONSTIPATION  Constipation is defined medically as fewer than three stools per week and severe constipation as less than one stool per week.  Even if you have a regular bowel pattern at home, your normal regimen is likely to be disrupted due to multiple reasons following surgery.  Combination of anesthesia, postoperative narcotics, change in appetite  and fluid intake all can affect your bowels.   YOU MUST use at least one of the following options; they are listed in order of increasing strength to get the job done.  They are all available over the counter, and you may need to use some, POSSIBLY even all of these options:    Drink plenty of fluids (prune juice may be helpful) and high fiber foods Colace 100 mg by mouth twice a day  Senokot for constipation as directed and as needed Dulcolax (bisacodyl), take with full glass of water  Miralax (polyethylene glycol) once or twice a day as needed.  If you have tried all these things and are unable to have a bowel movement in the first 3-4 days after surgery call either your surgeon or your primary doctor.    If you experience loose stools or diarrhea, hold the medications until you stool forms back up.  If your symptoms do not get better within 1 week or if they get worse, check with your doctor.  If you experience "the worst abdominal pain ever" or develop nausea or vomiting, please contact  the office immediately for further recommendations for treatment.   ITCHING:  If you experience itching with your medications, try taking only a single pain pill, or even half a pain pill at a time.  You can also use Benadryl over the counter for itching or also to help with sleep.   TED HOSE STOCKINGS:  Use stockings on both legs until for at least 2 weeks or as directed by physician office. They may be removed at night for sleeping.  MEDICATIONS:  See your medication summary on the "After Visit Summary" that nursing will review with you.  You may have some home medications which will be placed on hold until you complete the course of blood thinner medication.  It is important for you to complete the blood thinner medication as prescribed.  PRECAUTIONS:  If you experience chest pain or shortness of breath - call 911 immediately for transfer to the hospital emergency department.   If you develop a fever  greater that 101 F, purulent drainage from wound, increased redness or drainage from wound, foul odor from the wound/dressing, or calf pain - CONTACT YOUR SURGEON.                                                   FOLLOW-UP APPOINTMENTS:  If you do not already have a post-op appointment, please call the office for an appointment to be seen by your surgeon.  Guidelines for how soon to be seen are listed in your "After Visit Summary", but are typically between 1-4 weeks after surgery.  OTHER INSTRUCTIONS:   Knee Replacement:  Do not place pillow under knee, focus on keeping the knee straight while resting. CPM instructions: 0-90 degrees, 2 hours in the morning, 2 hours in the afternoon, and 2 hours in the evening. Place foam block, curve side up under heel at all times except when in CPM or when walking.  DO NOT modify, tear, cut, or change the foam block in any way.  MAKE SURE YOU:  Understand these instructions.  Get help right away if you are not doing well or get worse.    Thank you for letting us be a part of your medical care team.  It is a privilege we respect greatly.  We hope these instructions will help you stay on track for a fast and full recovery!     Do not put a pillow under the knee. Place it under the heel.    Complete by:  As directed      Driving restrictions    Complete by:  As directed   No driving for 6 weeks     Increase activity slowly as tolerated    Complete by:  As directed      Lifting restrictions    Complete by:  As directed   No lifting for 6 weeks     Partial weight bearing    Complete by:  As directed   % Body Weight:  50%  Laterality:  left  Extremity:  Lower     Patient may shower    Complete by:  As directed   You may shower over your dressing     TED hose    Complete by:  As directed   Use stockings (TED hose) for 3 weeks on left  leg.  You may remove them  at night for sleeping.           DISCHARGE MEDICATIONS:     Medication List    STOP  taking these medications        aspirin 81 MG tablet     diclofenac 75 MG EC tablet  Commonly known as:  VOLTAREN      TAKE these medications        carbidopa-levodopa 25-100 MG tablet  Commonly known as:  SINEMET IR  Take 1 tablet by mouth 3 (three) times daily.     lisinopril 10 MG tablet  Commonly known as:  PRINIVIL,ZESTRIL  Take 1 tablet by mouth daily.     methocarbamol 500 MG tablet  Commonly known as:  ROBAXIN  Take 1 tablet (500 mg total) by mouth every 8 (eight) hours as needed for muscle spasms.     metoprolol succinate 50 MG 24 hr tablet  Commonly known as:  TOPROL-XL  Take 1 tablet by mouth daily.     multivitamin with minerals tablet  Take 1 tablet by mouth daily.     omeprazole 20 MG capsule  Commonly known as:  PRILOSEC  Take 20 mg by mouth daily.     oxyCODONE 5 MG immediate release tablet  Commonly known as:  Oxy IR/ROXICODONE  Take 1-2 tablets (5-10 mg total) by mouth every 4 (four) hours as needed for breakthrough pain.     rivaroxaban 10 MG Tabs tablet  Commonly known as:  XARELTO  Take 1 tablet (10 mg total) by mouth daily with breakfast.     trihexyphenidyl 2 MG tablet  Commonly known as:  ARTANE  Take 2 mg by mouth daily.        FOLLOW UP VISIT:       Follow-up Information    Follow up with [facility].   Why:  They will contact you to schedule home therapy visits.   Contact information:   [address] [place] [address] [phone]       Follow up with Garald Balding, MD. Go on 11/23/2015.   Specialty:  Orthopedic Surgery   Contact information:   [address] [address] [phone]       DISPOSITION:   Home  CONDITION:  Stable   Mike Craze. [place], [place] [phone]  11/10/2015 10:32 AM

## 2015-11-10 NOTE — Progress Notes (Signed)
Physical Therapy Treatment Patient Details Name: Jeff Greer MRN: [id number] DOB: August 18, 1946 Today's Date: 11/10/2015    History of Present Illness 69 yo male s/p Left total knee arthroplasty with history of Parkinson's disease    PT Comments    Patient progressing well and is modified independent for all mobility. Patient has great family support at discharge and safe for d/c home with family.  Patient will need HHPT to progress ambulation and independence as well as ROM/strength.  Follow Up Recommendations  Home health PT;Supervision - Intermittent     Equipment Recommendations  None recommended by PT    Recommendations for Other Services       Precautions / Restrictions Precautions Precautions: Fall;Knee Precaution Comments: reviewed handout Restrictions Weight Bearing Restrictions: Yes LLE Weight Bearing: Partial weight bearing LLE Partial Weight Bearing Percentage or Pounds: 50    Mobility  Bed Mobility Overal bed mobility: Independent                Transfers Overall transfer level: Modified independent Equipment used: Rolling walker (2 wheeled) Transfers: Sit to/from Stand Sit to Stand: Modified independent (Device/Increase time)         General transfer comment: reminders to lower self slowly when sitting  Ambulation/Gait Ambulation/Gait assistance: Modified independent (Device/Increase time) Ambulation Distance (Feet): 100 Feet Assistive device: Rolling walker (2 wheeled) Gait Pattern/deviations: Step-to pattern Gait velocity: decreased Gait velocity interpretation: Below normal speed for age/gender General Gait Details: working toward more step-through gait; able to maintain PWB.   Stairs Stairs: Yes Stairs assistance: Min assist Stair Management: No rails;Backwards Number of Stairs: 2 General stair comments: educated daughter on how to hold RW when navigating steps; patient able to maintain PWB up/down stairs  Wheelchair  Mobility    Modified Rankin (Stroke Patients Only)       Balance Overall balance assessment: No apparent balance deficits (not formally assessed)                                  Cognition Arousal/Alertness: Awake/alert Behavior During Therapy: WFL for tasks assessed/performed Overall Cognitive Status: Within Functional Limits for tasks assessed                      Exercises Total Joint Exercises Ankle Circles/Pumps: AROM;Both;10 reps;Seated Quad Sets: Strengthening;Both;10 reps;Seated Towel Squeeze: AROM;Strengthening;Both;10 reps;Seated Long Arc Quad: AROM;Left;10 reps;Seated Knee Flexion: AROM;Left;10 reps;Seated Goniometric ROM: 85 degrees flexion    General Comments        Pertinent Vitals/Pain Pain Assessment: 0-10 Pain Score: 2  Pain Location: lt knee during exercises Pain Descriptors / Indicators: Aching Pain Intervention(s): Monitored during session    Home Living                      Prior Function            PT Goals (current goals can now be found in the care plan section) Progress towards PT goals: Goals met/education completed, patient discharged from PT    Frequency  7X/week    PT Plan Current plan remains appropriate    Co-evaluation             End of Session   Activity Tolerance: Patient tolerated treatment well Patient left: in chair;with call bell/phone within reach;with family/visitor present     Time: 5885-0277 PT Time Calculation (min) (ACUTE ONLY): 28 min  Charges:  $Gait Training: 8-22 mins $  Therapeutic Exercise: 8-22 mins                    G Codes:      Shanna Cisco 2015-12-10, 10:28 AM Dec 10, 2015 [place], [place]

## 2015-11-10 NOTE — Progress Notes (Signed)
Patient ID: Jeff Greer, male   DOB: 14-Jul-1946, 69 y.o.   MRN: [id number] PATIENT ID: Jeff Greer        MRN:  [id number]          DOB/AGE: 03/05/1946 / 69 y.o.    Joni Fears, MD   Biagio Borg, PA-C [address] [place], [place]  [address]                             [phone]   PROGRESS NOTE  Subjective:  negative for Chest Pain  negative for Shortness of Breath  negative for Nausea/Vomiting   negative for Calf Pain    Tolerating Diet: yes         Patient reports pain as mild and moderate.     Desires discharge home  Objective: Vital signs in last 24 hours:   Patient Vitals for the past 24 hrs:  BP Temp Temp src Pulse Resp SpO2  11/10/15 0625 132/66 mmHg 98.5 F (36.9 C) Oral 81 16 95 %  11/09/15 2115 (!) 128/51 mmHg 98.3 F (36.8 C) Oral 81 16 94 %  11/09/15 1341 (!) 174/98 mmHg 98.8 F (37.1 C) - 80 18 100 %      Intake/Output from previous day:   12/21 0701 - 12/22 0700 In: 1080 [P.O.:1080] Out: 650 [Urine:650]   Intake/Output this shift:       Intake/Output      12/21 0701 - 12/22 0700 12/22 0701 - 12/23 0700   P.O. 1080    I.V. (mL/kg)     IV Piggyback     Total Intake(mL/kg) 1080 (12.9)    Urine (mL/kg/hr) 650 (0.3)    Drains     Total Output 650     Net +430             LABORATORY DATA:  Recent Labs  11/09/15 0534 11/10/15 0610  WBC 7.6 8.7  HGB 12.1* 11.3*  HCT 36.4* 33.6*  PLT 166 174    Recent Labs  11/09/15 0534 11/10/15 0610  NA 140 138  K 4.0 3.7  CL 107 107  CO2 27 24  BUN 9 9  CREATININE 0.81 0.68  GLUCOSE 97 101*  CALCIUM 8.4* 8.4*   Lab Results  Component Value Date   INR 1.07 11/02/2015    Recent Radiographic Studies :  Dg Chest 2 View  11/02/2015  CLINICAL DATA:  Hypertension.  COPD. EXAM: CHEST  2 VIEW COMPARISON:  12/09/2002. FINDINGS: Mediastinum and hilar structures are normal. No focal infiltrate. COPD. Biapical pleural parenchymal thickening noted most consistent with scarring.  Prominent linear scar is in the right upper lobe. No pleural effusion pneumothorax. Heart size normal. Prominence sliding hiatal hernia. No acute bony abnormality. IMPRESSION: 1. COPD and bilateral pleural-parenchymal scarring. 2. Prominent sliding hiatal hernia . Electronically Signed   By: Marcello Moores  Register   On: 11/02/2015 16:52     Examination:  General appearance: alert, cooperative, mild distress and moderate distress  Wound Exam: clean, dry, intact   Drainage:  None: wound tissue dry  Motor Exam: EHL, FHL, Anterior Tibial and Posterior Tibial Intact  Sensory Exam: Superficial Peroneal, Deep Peroneal and Tibial normal  Vascular Exam: Left posterior tibial artery has 1+ (weak) pulse  Assessment:    2 Days Post-Op  Procedure(s) (LRB): TOTAL KNEE ARTHROPLASTY (Left)  ADDITIONAL DIAGNOSIS:  Principal Problem:   Primary osteoarthritis of left knee Active Problems:   Parkinson  disease, symptomatic ([place])   Hypertension   S/P total knee replacement using cement    Plan: Physical Therapy as ordered Partial Weight Bearing @ 50% (PWB)  DVT Prophylaxis:  Xarelto, Foot Pumps and TED hose  DISCHARGE PLAN: Home  DISCHARGE NEEDS: HHPT, CPM, Walker and 3-in-1 comode seat        PETRARCA,BRIAN  11/10/2015 10:24 AM

## 2015-11-12 DIAGNOSIS — Z87891 Personal history of nicotine dependence: Secondary | ICD-10-CM | POA: Diagnosis not present

## 2015-11-12 DIAGNOSIS — Z96652 Presence of left artificial knee joint: Secondary | ICD-10-CM | POA: Diagnosis not present

## 2015-11-12 DIAGNOSIS — J449 Chronic obstructive pulmonary disease, unspecified: Secondary | ICD-10-CM | POA: Diagnosis not present

## 2015-11-12 DIAGNOSIS — I10 Essential (primary) hypertension: Secondary | ICD-10-CM | POA: Diagnosis not present

## 2015-11-12 DIAGNOSIS — Z471 Aftercare following joint replacement surgery: Secondary | ICD-10-CM | POA: Diagnosis not present

## 2015-11-12 DIAGNOSIS — G20 Parkinson's disease: Secondary | ICD-10-CM | POA: Diagnosis not present

## 2015-11-15 DIAGNOSIS — Z87891 Personal history of nicotine dependence: Secondary | ICD-10-CM | POA: Diagnosis not present

## 2015-11-15 DIAGNOSIS — I10 Essential (primary) hypertension: Secondary | ICD-10-CM | POA: Diagnosis not present

## 2015-11-15 DIAGNOSIS — Z471 Aftercare following joint replacement surgery: Secondary | ICD-10-CM | POA: Diagnosis not present

## 2015-11-15 DIAGNOSIS — J449 Chronic obstructive pulmonary disease, unspecified: Secondary | ICD-10-CM | POA: Diagnosis not present

## 2015-11-15 DIAGNOSIS — G20 Parkinson's disease: Secondary | ICD-10-CM | POA: Diagnosis not present

## 2015-11-15 DIAGNOSIS — Z96652 Presence of left artificial knee joint: Secondary | ICD-10-CM | POA: Diagnosis not present

## 2015-11-18 DIAGNOSIS — Z471 Aftercare following joint replacement surgery: Secondary | ICD-10-CM | POA: Diagnosis not present

## 2015-11-18 DIAGNOSIS — G20 Parkinson's disease: Secondary | ICD-10-CM | POA: Diagnosis not present

## 2015-11-18 DIAGNOSIS — J449 Chronic obstructive pulmonary disease, unspecified: Secondary | ICD-10-CM | POA: Diagnosis not present

## 2015-11-18 DIAGNOSIS — Z87891 Personal history of nicotine dependence: Secondary | ICD-10-CM | POA: Diagnosis not present

## 2015-11-18 DIAGNOSIS — Z96652 Presence of left artificial knee joint: Secondary | ICD-10-CM | POA: Diagnosis not present

## 2015-11-18 DIAGNOSIS — I10 Essential (primary) hypertension: Secondary | ICD-10-CM | POA: Diagnosis not present

## 2015-11-22 DIAGNOSIS — Z87891 Personal history of nicotine dependence: Secondary | ICD-10-CM | POA: Diagnosis not present

## 2015-11-22 DIAGNOSIS — G20 Parkinson's disease: Secondary | ICD-10-CM | POA: Diagnosis not present

## 2015-11-22 DIAGNOSIS — Z471 Aftercare following joint replacement surgery: Secondary | ICD-10-CM | POA: Diagnosis not present

## 2015-11-22 DIAGNOSIS — J449 Chronic obstructive pulmonary disease, unspecified: Secondary | ICD-10-CM | POA: Diagnosis not present

## 2015-11-22 DIAGNOSIS — I10 Essential (primary) hypertension: Secondary | ICD-10-CM | POA: Diagnosis not present

## 2015-11-22 DIAGNOSIS — Z96652 Presence of left artificial knee joint: Secondary | ICD-10-CM | POA: Diagnosis not present

## 2015-11-23 DIAGNOSIS — M1712 Unilateral primary osteoarthritis, left knee: Secondary | ICD-10-CM | POA: Diagnosis not present

## 2015-11-25 DIAGNOSIS — Z471 Aftercare following joint replacement surgery: Secondary | ICD-10-CM | POA: Diagnosis not present

## 2015-11-25 DIAGNOSIS — G20 Parkinson's disease: Secondary | ICD-10-CM | POA: Diagnosis not present

## 2015-11-25 DIAGNOSIS — Z87891 Personal history of nicotine dependence: Secondary | ICD-10-CM | POA: Diagnosis not present

## 2015-11-25 DIAGNOSIS — I10 Essential (primary) hypertension: Secondary | ICD-10-CM | POA: Diagnosis not present

## 2015-11-25 DIAGNOSIS — J449 Chronic obstructive pulmonary disease, unspecified: Secondary | ICD-10-CM | POA: Diagnosis not present

## 2015-11-25 DIAGNOSIS — Z96652 Presence of left artificial knee joint: Secondary | ICD-10-CM | POA: Diagnosis not present

## 2015-11-30 ENCOUNTER — Ambulatory Visit: Payer: Medicare Other | Attending: Orthopaedic Surgery | Admitting: Physical Therapy

## 2015-11-30 DIAGNOSIS — R29898 Other symptoms and signs involving the musculoskeletal system: Secondary | ICD-10-CM | POA: Insufficient documentation

## 2015-11-30 DIAGNOSIS — M25562 Pain in left knee: Secondary | ICD-10-CM | POA: Insufficient documentation

## 2015-11-30 DIAGNOSIS — M25462 Effusion, left knee: Secondary | ICD-10-CM | POA: Insufficient documentation

## 2015-11-30 DIAGNOSIS — M25662 Stiffness of left knee, not elsewhere classified: Secondary | ICD-10-CM

## 2015-11-30 NOTE — Therapy (Signed)
[facility] [place], [place], [address] Phone: [phone]   Fax:  [phone]  Physical Therapy Evaluation  Patient Details  Name: Jeff Greer MRN: [id number] Date of Birth: 1945/12/22 Referring Provider: Joni Fears MD  Encounter Date: 11/30/2015      PT End of Session - 11/30/15 0930    Visit Number 1   Number of Visits 12   Date for PT Re-Evaluation 01/11/16   PT Start Time 0859   PT Stop Time 0951   PT Time Calculation (min) 52 min   Activity Tolerance Patient tolerated treatment well   Behavior During Therapy [facility] for tasks assessed/performed      Past Medical History  Diagnosis Date  . Hypertension   . Emphysema lung ([place])   . GERD (gastroesophageal reflux disease)   . History of hiatal hernia   . Arthritis   . Parkinson's disease [facility])     Past Surgical History  Procedure Laterality Date  . Ankle fracture surgery      RIGHT  . Colon surgery    . Knee arthroscopy      LEFT  . Total knee arthroplasty Left 11/08/2015  . Total knee arthroplasty Left 11/08/2015    Procedure: TOTAL KNEE ARTHROPLASTY;  Surgeon: Garald Balding, MD;  Location: [place];  Service: Orthopedics;  Laterality: Left;    Filed Vitals:   11/30/15 0939  SpO2: 96%    Visit Diagnosis:  Left knee pain - Plan: PT plan of care cert/re-cert  Swelling of left knee joint - Plan: PT plan of care cert/re-cert  Decreased range of motion of left knee - Plan: PT plan of care cert/re-cert      Subjective Assessment - 11/30/15 0908    Currently in Pain? No/denies  No pain at rest today.            [facility] PT Assessment - 11/30/15 0001    Assessment   Medical Diagnosis Left totalknee replacement.   Referring Provider Joni Fears MD   Onset Date/Surgical Date --  11/08/15 (surgery date).   Precautions   Precautions --  No ultrasound.   Restrictions   Weight Bearing Restrictions No   Balance Screen   Has the  patient fallen in the past 6 months No   Has the patient had a decrease in activity level because of a fear of falling?  No   Is the patient reluctant to leave their home because of a fear of falling?  Yes   Home Environment   Additional Comments 12 stairs.   Prior Function   Level of Independence Independent   Observation/Other Assessments   Observations incision looks good.  5 steri-strips itact.   Observation/Other Assessments-Edema    Edema Circumferential   Circumferential Edema   Circumferential - Right Mid-patellar= 38 cms.   Circumferential - Left  41.5 cms.   ROM / Strength   AROM / PROM / Strength AROM;PROM;Strength   AROM   Overall AROM Comments -5 to 115 degrees   PROM   Overall PROM Comments -3 to 120 degrees.   Strength   Overall Strength Comments Left hip strength= 5/5; left ankle= 5/5; left quad 4=/5 compared to right with left quad atrophy noted.   Palpation   Palpation comment Tender to palpation left adductor tubercle.   Ambulation/Gait   Gait Comments Patient ambulating safely in clinic wihtout assistive device.  [facility] Adult PT Treatment/Exercise - 27-Dec-2015 0001    Modalities   Modalities Electrical Stimulation;Vasopneumatic   Electrical Stimulation   Electrical Stimulation Location Left knee.   Electrical Stimulation Action 1-10 HZ at 100% scan x 15 minutes.   Electrical Stimulation Goals Edema;Pain   Vasopneumatic   Number Minutes Vasopneumatic  15 minutes   Vasopnuematic Location  --  left knee.   Vasopneumatic Pressure Medium                  PT Short Term Goals - 12/27/15 0933    PT SHORT TERM GOAL #1   Title Ind with an HEP.   Time 2   Period Weeks   Status New   PT SHORT TERM GOAL #2   Title Full active left knee extension.   Time 2   Period Weeks   Status New           PT Long Term Goals - 2015-12-27 0935    PT LONG TERM GOAL #1   Title Active left knee flexion to 125 degrees+ so the patient  can perform functional tasks and do so with pain not > 2-3/10.   Time 6   Period Weeks   Status New   PT LONG TERM GOAL #2   Title Increase left knee strength to a solid 5/5 to provide good stability for accomplishment of functional activities   Time 6   Period Weeks   Status New   PT LONG TERM GOAL #3   Title Perform a reciprocating stair gait with one railing with pain not > 2-3/10.   Time 6   Period Weeks   Status New   PT LONG TERM GOAL #4   Title Walk a community distance wiht pain not > 2/10.               Plan - December 27, 2015 0908    Clinical Impression Statement The patient underwent a left total knee replacement on 11/08/15.  He had some home health physical therapy.  He reports no pain at rest today but 9/10 after doing stair last night.  Patient has a right hand tremor due to Parkinson but states it does not cause him much impairment.   Pt will benefit from skilled therapeutic intervention in order to improve on the following deficits Decreased range of motion;Pain;Increased edema   Rehab Potential Excellent   PT Frequency 2x / week   PT Duration 6 weeks   PT Treatment/Interventions ADLs/Self Care Home Management;Cryotherapy;Electrical Stimulation;Therapeutic exercise;Therapeutic activities;Stair training;Neuromuscular re-education;Patient/family education;Manual techniques   PT Next Visit Plan Lew[place] total knee replacemtn protocol.  E'stim and vasopnuematic to decrease pain and edema.          G-Codes - 27-Dec-2015 [id number]    Functional Assessment Tool Used FOTO.Marland Kitchen52% limitation.   Functional Limitation Mobility: Walking and moving around   Mobility: Walking and Moving Around Current Status [phone]) At least 40 percent but less than 60 percent impaired, limited or restricted   Mobility: Walking and Moving Around Goal Status [phone]) At least 1 percent but less than 20 percent impaired, limited or restricted       Problem List Patient Active Problem List   Diagnosis Date  Noted  . Primary osteoarthritis of left knee 11/08/2015  . Parkinson disease, symptomatic ([place]) 11/08/2015  . Hypertension 11/08/2015  . S/P total knee replacement using cement 11/08/2015    APPLEGATE, [place] MPT December 27, 2015, 9:51 AM  [facility] [place], [place],  [place] Phone: [phone]   Fax:  [phone]  Name: BENITO LEVITIN MRN: [id number] Date of Birth: 08/29/1946

## 2015-12-02 ENCOUNTER — Encounter: Payer: Self-pay | Admitting: Physical Therapy

## 2015-12-02 ENCOUNTER — Ambulatory Visit: Payer: Medicare Other | Admitting: Physical Therapy

## 2015-12-02 DIAGNOSIS — M25662 Stiffness of left knee, not elsewhere classified: Secondary | ICD-10-CM

## 2015-12-02 DIAGNOSIS — M25462 Effusion, left knee: Secondary | ICD-10-CM | POA: Diagnosis not present

## 2015-12-02 DIAGNOSIS — R29898 Other symptoms and signs involving the musculoskeletal system: Secondary | ICD-10-CM | POA: Diagnosis not present

## 2015-12-02 DIAGNOSIS — M25562 Pain in left knee: Secondary | ICD-10-CM

## 2015-12-02 NOTE — Therapy (Signed)
[facility] [place], [place], [address] Phone: [phone]   Fax:  [phone]  Physical Therapy Treatment  Patient Details  Name: Jeff Greer MRN: [id number] Date of Birth: 1946-11-19 Referring Provider: Joni Fears MD  Encounter Date: 12/02/2015      PT End of Session - 12/02/15 0732    Visit Number 2   Number of Visits 12   Date for PT Re-Evaluation 01/11/16   PT Start Time 0731   PT Stop Time 0817   PT Time Calculation (min) 46 min   Activity Tolerance Patient tolerated treatment well   Behavior During Therapy [facility] for tasks assessed/performed      Past Medical History  Diagnosis Date  . Hypertension   . Emphysema lung ([place])   . GERD (gastroesophageal reflux disease)   . History of hiatal hernia   . Arthritis   . Parkinson's disease [facility])     Past Surgical History  Procedure Laterality Date  . Ankle fracture surgery      RIGHT  . Colon surgery    . Knee arthroscopy      LEFT  . Total knee arthroplasty Left 11/08/2015  . Total knee arthroplasty Left 11/08/2015    Procedure: TOTAL KNEE ARTHROPLASTY;  Surgeon: Garald Balding, MD;  Location: [place];  Service: Orthopedics;  Laterality: Left;    There were no vitals filed for this visit.  Visit Diagnosis:  Left knee pain  Swelling of left knee joint  Decreased range of motion of left knee      Subjective Assessment - 12/02/15 0731    Subjective Reports that R knee is sore today.   Patient Stated Goals get back to normal.   Currently in Pain? Yes   Pain Score 3    Pain Location Knee   Pain Orientation Left   Pain Descriptors / Indicators Sore            [facility] PT Assessment - 12/02/15 0001    Assessment   Medical Diagnosis Left totalknee replacement.   Onset Date/Surgical Date 11/08/15   Next MD Visit 12/07/2015   Restrictions   Weight Bearing Restrictions No   ROM / Strength   AROM / PROM / Strength AROM   AROM   Overall  AROM  Within functional limits for tasks performed;Deficits   AROM Assessment Site Knee   Right/Left Knee Left   Left Knee Extension 0   Left Knee Flexion 116   Palpation   Patella mobility Demonstrated good L patellar mobility in L/R, sup/inf directions                     [facility] Adult PT Treatment/Exercise - 12/02/15 0001    Exercises   Exercises Knee/Hip   Knee/Hip Exercises: Aerobic   Stationary Bike L0, seat 8 x12 min   Knee/Hip Exercises: Standing   Forward Lunges Left;2 sets;10 reps;2 seconds;Other (comment)  14" step   Rocker Board 3 minutes   Knee/Hip Exercises: Seated   Long Arc Quad Strengthening;Left;3 sets;10 reps;Weights   Long Arc Quad Weight 4 lbs.   Knee/Hip Exercises: Supine   Straight Leg Raises Strengthening;Left;2 sets;10 reps   Knee/Hip Exercises: Sidelying   Hip ABduction Strengthening;Left;2 sets;10 reps   Modalities   Modalities Electrical Stimulation;Vasopneumatic   Electrical Stimulation   Electrical Stimulation Location Left knee.   Electrical Stimulation Action Pre-Mod   Electrical Stimulation Parameters 80-150 Hz x15 min   Electrical Stimulation Goals Edema;Pain  Vasopneumatic   Number Minutes Vasopneumatic  15 minutes   Vasopnuematic Location  Knee   Vasopneumatic Pressure Medium   Vasopneumatic Temperature  48   Manual Therapy   Manual Therapy Soft tissue mobilization   Soft tissue mobilization L patellar mobility in L/R, sup/inf directions in supine; L incision mobilizations in mid to superior aspect of the incision in circles, scabbing still present in the inferior aspect of the incision                  PT Short Term Goals - 12/02/15 0803    PT SHORT TERM GOAL #1   Title Ind with an HEP.   Time 2   Period Weeks   Status On-going   PT SHORT TERM GOAL #2   Title Full active left knee extension.   Time 2   Period Weeks   Status Achieved  AROM L knee ext 0 deg 12/02/2015           PT Long Term Goals -  12/02/15 0803    PT LONG TERM GOAL #1   Title Active left knee flexion to 125 degrees+ so the patient can perform functional tasks and do so with pain not > 2-3/10.   Time 6   Period Weeks   Status On-going  AROM L knee flex 116 deg 12/02/2015   PT LONG TERM GOAL #2   Title Increase left knee strength to a solid 5/5 to provide good stability for accomplishment of functional activities   Time 6   Period Weeks   Status On-going   PT LONG TERM GOAL #3   Title Perform a reciprocating stair gait with one railing with pain not > 2-3/10.   Time 6   Period Weeks   Status On-going   PT LONG TERM GOAL #4   Title Walk a community distance wiht pain not > 2/10.   Status On-going               Plan - 12/02/15 0803    Clinical Impression Statement Patient tolerated today's treatment well with no complaints of increased pain with any activities. Reports that soreness is present most with L knee flexion. Completed all exercises well with only minimal mulitmodal cueing for proper exercise technique. AROM L knee measured as 0-116 deg in supine. Demonstrated good L patellar mobility in all directions with inflammation present around the L patella. Incision demosntrated good mid and superior mobility and scabs present in the inferior aspect of the incision. No steristrips present over the L knee incision at this time. Normal modalities response noted following removal of the modalities. Experienced very little L knee pain following today's treatment per patient report.   Pt will benefit from skilled therapeutic intervention in order to improve on the following deficits Decreased range of motion;Pain;Increased edema   Rehab Potential Excellent   PT Frequency 2x / week   PT Duration 6 weeks   PT Treatment/Interventions ADLs/Self Care Home Management;Cryotherapy;Electrical Stimulation;Therapeutic exercise;Therapeutic activities;Stair training;Neuromuscular re-education;Patient/family education;Manual  techniques   PT Next Visit Plan Continue per MPT POC and TKR protocol with modalities PRN for pain.    Consulted and Agree with Plan of Care Patient        Problem List Patient Active Problem List   Diagnosis Date Noted  . Primary osteoarthritis of left knee 11/08/2015  . Parkinson disease, symptomatic ([place]) 11/08/2015  . Hypertension 11/08/2015  . S/P total knee replacement using cement 11/08/2015    Wynelle Fanny, PTA  12/02/2015, 8:20 AM  [facility] [place], [place], [address] Phone: [phone]   Fax:  [phone]  Name: Jeff Greer MRN: [id number] Date of Birth: 1946/05/03

## 2015-12-06 ENCOUNTER — Encounter: Payer: Self-pay | Admitting: Physical Therapy

## 2015-12-06 ENCOUNTER — Ambulatory Visit: Payer: Medicare Other | Admitting: Physical Therapy

## 2015-12-06 DIAGNOSIS — M25662 Stiffness of left knee, not elsewhere classified: Secondary | ICD-10-CM

## 2015-12-06 DIAGNOSIS — M25462 Effusion, left knee: Secondary | ICD-10-CM

## 2015-12-06 DIAGNOSIS — R29898 Other symptoms and signs involving the musculoskeletal system: Secondary | ICD-10-CM | POA: Diagnosis not present

## 2015-12-06 DIAGNOSIS — M25562 Pain in left knee: Secondary | ICD-10-CM

## 2015-12-06 NOTE — Therapy (Signed)
[facility] [place], [place], [address] Phone: [phone]   Fax:  [phone]  Physical Therapy Treatment  Patient Details  Name: Jeff Greer MRN: [id number] Date of Birth: 11/13/1946 Referring Provider: Joni Fears MD  Encounter Date: 12/06/2015      PT End of Session - 12/06/15 1120    Visit Number 3   Number of Visits 12   PT Start Time 1119   PT Stop Time 1207   PT Time Calculation (min) 48 min   Activity Tolerance Patient tolerated treatment well   Behavior During Therapy [facility] for tasks assessed/performed      Past Medical History  Diagnosis Date  . Hypertension   . Emphysema lung ([place])   . GERD (gastroesophageal reflux disease)   . History of hiatal hernia   . Arthritis   . Parkinson's disease [facility])     Past Surgical History  Procedure Laterality Date  . Ankle fracture surgery      RIGHT  . Colon surgery    . Knee arthroscopy      LEFT  . Total knee arthroplasty Left 11/08/2015  . Total knee arthroplasty Left 11/08/2015    Procedure: TOTAL KNEE ARTHROPLASTY;  Surgeon: Garald Balding, MD;  Location: [place];  Service: Orthopedics;  Laterality: Left;    There were no vitals filed for this visit.  Visit Diagnosis:  Left knee pain  Swelling of left knee joint  Decreased range of motion of left knee      Subjective Assessment - 12/06/15 1119    Subjective Reports that L knee is sore today and was yesterday as well.   Patient Stated Goals get back to normal.   Currently in Pain? Yes   Pain Score 4    Pain Location Knee   Pain Orientation Left   Pain Descriptors / Indicators Sore            [facility] PT Assessment - 12/06/15 0001    Assessment   Medical Diagnosis Left totalknee replacement.   Onset Date/Surgical Date 11/08/15   Next MD Visit 12/07/2015   Observation/Other Assessments-Edema    Edema Circumferential   Circumferential Edema   Circumferential - Right midpatellar  38.8 cm   Circumferential - Left  midpatellar 41 cm   ROM / Strength   AROM / PROM / Strength AROM   AROM   Overall AROM  Within functional limits for tasks performed;Deficits   AROM Assessment Site Knee   Right/Left Knee Left   Left Knee Extension 0   Left Knee Flexion 122   Palpation   Patella mobility Demonstrated good L patellar mobility in L/R, sup/inf directions                     [facility] Adult PT Treatment/Exercise - 12/06/15 0001    Knee/Hip Exercises: Aerobic   Stationary Bike L1 x12 min   Knee/Hip Exercises: Standing   Forward Lunges Left;2 sets;10 reps;2 seconds;Other (comment)  14" step   Lateral Step Up Left;3 sets;10 reps;Hand Hold: 0;Step Height: 6"   Forward Step Up Left;3 sets;10 reps;Hand Hold: 1;Step Height: 6"   Rocker Board 3 minutes   Knee/Hip Exercises: Seated   Long Arc Quad Strengthening;Left;3 sets;10 reps;Weights   Long Arc Quad Weight 4 lbs.   Knee/Hip Exercises: Sidelying   Hip ABduction Strengthening;Left;2 sets;10 reps   Modalities   Modalities Electrical Stimulation;Vasopneumatic   Electrical Stimulation   Electrical Stimulation Location Left knee.  Electrical Stimulation Action IFC   Electrical Stimulation Parameters 1-10 Hz x15 min   Electrical Stimulation Goals Edema;Pain   Vasopneumatic   Number Minutes Vasopneumatic  15 minutes   Vasopnuematic Location  Knee   Vasopneumatic Pressure Medium   Vasopneumatic Temperature  48   Manual Therapy   Manual Therapy Soft tissue mobilization   Soft tissue mobilization L patellar mobility in L/R, sup/inf directions in supine; L incision mobilizations to mobilize incision and prevent adhesions                  PT Short Term Goals - 12/02/15 0803    PT SHORT TERM GOAL #1   Title Ind with an HEP.   Time 2   Period Weeks   Status On-going   PT SHORT TERM GOAL #2   Title Full active left knee extension.   Time 2   Period Weeks   Status Achieved  AROM L knee ext 0 deg  12/02/2015           PT Long Term Goals - 12/06/15 1153    PT LONG TERM GOAL #1   Title Active left knee flexion to 125 degrees+ so the patient can perform functional tasks and do so with pain not > 2-3/10.   Time 6   Period Weeks   Status On-going  AROM L knee flex 122 deg 12/06/2015   PT LONG TERM GOAL #2   Title Increase left knee strength to a solid 5/5 to provide good stability for accomplishment of functional activities   Time 6   Period Weeks   Status On-going   PT LONG TERM GOAL #3   Title Perform a reciprocating stair gait with one railing with pain not > 2-3/10.   Time 6   Period Weeks   Status On-going   PT LONG TERM GOAL #4   Title Walk a community distance wiht pain not > 2/10.   Status On-going               Plan - 12/06/15 1156    Clinical Impression Statement Patient tolerated today's treatment well today with only complaint of hip discomfort on stationary bike and popping sensation during lateral step ups. Completed all exercises well today and was able to tolerate new step p strengthening well and per patient report the popping sensation during lateral step ups were not painful. AROM of the L knee was measured as 0-122 deg today in supine. Contiues to demonstrate good L patellar mobility in all directions assessed today. Demonstrates good L incision mobility as well with most scabbing gone except for in the very inferior tip of incision and 1 cm long scab 3 cm from inferior tip of incision. 2.2 cm difference in edema with L>R. Normal modalities response noted following removal of the modalities. Patient denied pain following today's treatment.   Pt will benefit from skilled therapeutic intervention in order to improve on the following deficits Decreased range of motion;Pain;Increased edema   Rehab Potential Excellent   PT Frequency 2x / week   PT Duration 6 weeks   PT Treatment/Interventions ADLs/Self Care Home Management;Cryotherapy;Electrical  Stimulation;Therapeutic exercise;Therapeutic activities;Stair training;Neuromuscular re-education;Patient/family education;Manual techniques   PT Next Visit Plan Continue per MPT POC and TKR protocol with modalities PRN for pain.    Consulted and Agree with Plan of Care Patient        Problem List Patient Active Problem List   Diagnosis Date Noted  . Primary osteoarthritis of left knee 11/08/2015  .  Parkinson disease, symptomatic ([place]) 11/08/2015  . Hypertension 11/08/2015  . S/P total knee replacement using cement 11/08/2015    Ahmed Prima, PTA 12/06/2015 12:19 PM [place] MPT [facility] [place], [place], [address] Phone: [phone]   Fax:  [phone]  Name: Jeff Greer MRN: [id number] Date of Birth: 01-09-1946

## 2015-12-08 ENCOUNTER — Ambulatory Visit: Payer: Medicare Other | Admitting: Physical Therapy

## 2015-12-08 ENCOUNTER — Encounter: Payer: Self-pay | Admitting: Physical Therapy

## 2015-12-08 DIAGNOSIS — M25462 Effusion, left knee: Secondary | ICD-10-CM | POA: Diagnosis not present

## 2015-12-08 DIAGNOSIS — M25662 Stiffness of left knee, not elsewhere classified: Secondary | ICD-10-CM

## 2015-12-08 DIAGNOSIS — M25562 Pain in left knee: Secondary | ICD-10-CM | POA: Diagnosis not present

## 2015-12-08 DIAGNOSIS — R29898 Other symptoms and signs involving the musculoskeletal system: Secondary | ICD-10-CM | POA: Diagnosis not present

## 2015-12-08 NOTE — Therapy (Signed)
[facility] [place], [place], [address] Phone: [phone]   Fax:  [phone]  Physical Therapy Treatment  Patient Details  Name: Jeff Greer MRN: [id number] Date of Birth: 1946/07/07 Referring Provider: Joni Fears MD  Encounter Date: 12/08/2015      PT End of Session - 12/08/15 0735    Visit Number 4   Number of Visits 12   Date for PT Re-Evaluation 01/11/16   PT Start Time 0734   PT Stop Time 0819   PT Time Calculation (min) 45 min   Activity Tolerance Patient tolerated treatment well   Behavior During Therapy [facility] for tasks assessed/performed      Past Medical History  Diagnosis Date  . Hypertension   . Emphysema lung ([place])   . GERD (gastroesophageal reflux disease)   . History of hiatal hernia   . Arthritis   . Parkinson's disease [facility])     Past Surgical History  Procedure Laterality Date  . Ankle fracture surgery      RIGHT  . Colon surgery    . Knee arthroscopy      LEFT  . Total knee arthroplasty Left 11/08/2015  . Total knee arthroplasty Left 11/08/2015    Procedure: TOTAL KNEE ARTHROPLASTY;  Surgeon: Garald Balding, MD;  Location: [place];  Service: Orthopedics;  Laterality: Left;    There were no vitals filed for this visit.  Visit Diagnosis:  Left knee pain  Swelling of left knee joint  Decreased range of motion of left knee      Subjective Assessment - 12/08/15 0734    Subjective Reports that MD told him he was doing good and states that today is his last day. States that swelling in L knee has gone down. States that he walks over a mile daily without problems and states that he has 12 stairs in his home and he has no problems with those.   Patient Stated Goals get back to normal.   Currently in Pain? Other (Comment)  "a little bit" but gave no numerical rating            [facility] PT Assessment - 12/08/15 0001    Assessment   Medical Diagnosis Left totalknee  replacement.   Onset Date/Surgical Date 11/08/15   Next MD Visit 02/2016   Restrictions   Weight Bearing Restrictions No   ROM / Strength   AROM / PROM / Strength AROM;Strength   AROM   Overall AROM  Within functional limits for tasks performed   AROM Assessment Site Knee   Right/Left Knee Left   Left Knee Extension 0   Left Knee Flexion 121   Strength   Overall Strength Deficits;Within functional limits for tasks performed   Strength Assessment Site Knee;Hip   Right/Left Hip Left   Left Hip Flexion 5/5   Left Hip ABduction 5/5   Right/Left Knee Left   Left Knee Flexion 4+/5   Left Knee Extension 5/5                     [facility] Adult PT Treatment/Exercise - 12/08/15 0001    Ambulation/Gait   Ambulation/Gait --   Stairs Yes   Stairs Assistance 7: Independent   Stair Management Technique No rails;Alternating pattern;Forwards   Number of Stairs 4   Height of Stairs 6   Knee/Hip Exercises: Aerobic   Stationary Bike L0 x12 min   Knee/Hip Exercises: Standing   Lateral Step Up Left;3 sets;10 reps;Hand  Hold: 0;Step Height: 6"   Forward Step Up Left;3 sets;10 reps;Hand Hold: 1;Step Height: 6"   Rocker Board 2 minutes   Knee/Hip Exercises: Seated   Long Arc Quad Strengthening;Left;3 sets;10 reps;Weights   Long Arc Quad Weight 4 lbs.   Knee/Hip Exercises: Supine   Straight Leg Raises Strengthening;Left;3 sets;10 reps   Knee/Hip Exercises: Sidelying   Hip ABduction Strengthening;Left;3 sets;10 reps   Modalities   Modalities Vasopneumatic   Vasopneumatic   Number Minutes Vasopneumatic  15 minutes   Vasopnuematic Location  Knee   Vasopneumatic Pressure Medium   Vasopneumatic Temperature  34                PT Education - 12/08/15 0750    Education provided Yes   Education Details HEP- forward/ lateral step   Person(s) Educated Patient   Methods Explanation;Verbal cues;Handout   Comprehension Verbalized understanding;Verbal cues required          PT  Short Term Goals - 12/08/15 0759    PT SHORT TERM GOAL #1   Title Ind with an HEP.   Time 2   Period Weeks   Status Achieved   PT SHORT TERM GOAL #2   Title Full active left knee extension.   Time 2   Period Weeks   Status Achieved  AROM L knee ext 0 deg 12/02/2015           PT Long Term Goals - 12/08/15 0736    PT LONG TERM GOAL #1   Title Active left knee flexion to 125 degrees+ so the patient can perform functional tasks and do so with pain not > 2-3/10.   Time 6   Period Weeks   Status Partially Met  AROM L knee flex 121 deg 12/08/2015   PT LONG TERM GOAL #2   Title Increase left knee strength to a solid 5/5 to provide good stability for accomplishment of functional activities   Time 6   Period Weeks   Status Partially Met  R knee MMT ranging from 4+/5 to 5/5 12/08/2015   PT LONG TERM GOAL #3   Title Perform a reciprocating stair gait with one railing with pain not > 2-3/10.   Time 6   Period Weeks   Status Achieved   PT LONG TERM GOAL #4   Title Walk a community distance wiht pain not > 2/10.   Status Achieved               Plan - 12/08/15 0806    Clinical Impression Statement Patient has tolerated PT well and advanced well with exercises and functional abilities. Has no difficulty with reciprical gait on stairs and has achieved all goals set at evaluation except only partially meeting L knee flexion goal of 125 deg and L knee strength goal with flex MMT4+/5 and ext 5/5. Tightness noted in the lateral HS attachment region and patient noted that he had experienced a pop in that area with a little pain. Patient was educated that if the pop continues with increased pain to contact the MD. Completes all exercises well with mod multimodal cueing required for correct technique and demonstration. Accepted HEP for home use without questions and verbalized understanding. AROM of L knee measured as 0-121 deg today in supine. R hip strength measured as 5/5 again as it was in  the evaluation. R knee strength flex 4+/5 and ext 5/5 measured in sitting. Normal vasopnuematic response noted following removal of the modality.   Pt will benefit  from skilled therapeutic intervention in order to improve on the following deficits Decreased range of motion;Pain;Increased edema   Rehab Potential Excellent   PT Frequency 2x / week   PT Duration 6 weeks   PT Treatment/Interventions ADLs/Self Care Home Management;Cryotherapy;Electrical Stimulation;Therapeutic exercise;Therapeutic activities;Stair training;Neuromuscular re-education;Patient/family education;Manual techniques   PT Next Visit Plan D/C summary required by MPT per patient request for D/C   Consulted and Agree with Plan of Care Patient        Problem List Patient Active Problem List   Diagnosis Date Noted  . Primary osteoarthritis of left knee 11/08/2015  . Parkinson disease, symptomatic ([place]) 11/08/2015  . Hypertension 11/08/2015  . S/P total knee replacement using cement 11/08/2015    Ahmed Prima, PTA 12/08/2015 8:22 AM  [facility] [address] [place], [place], [address] Phone: [phone]   Fax:  [phone]  Name: RAWLEIGH RODE MRN: [id number] Date of Birth: 02-Jan-1946

## 2015-12-08 NOTE — Patient Instructions (Signed)
Step-Down / Step-Up    Stand on stair step or __6__ inch stool. Slowly bend left leg, lowering other foot to floor. Return by straightening front leg. Repeat __10__ times per set. Do __3__ sets per session. Do __3__ sessions per day.  http://orth.exer.us/684   Copyright  VHI. All rights reserved.

## 2015-12-12 NOTE — Therapy (Signed)
[facility] [place], [place], [address] Phone: [phone]   Fax:  [phone]  Physical Therapy Treatment  Patient Details  Name: Jeff Greer MRN: [id number] Date of Birth: Mar 15, 1946 Referring Provider: Joni Fears MD  Encounter Date: 12/08/2015    Past Medical History  Diagnosis Date  . Hypertension   . Emphysema lung ([place])   . GERD (gastroesophageal reflux disease)   . History of hiatal hernia   . Arthritis   . Parkinson's disease [facility])     Past Surgical History  Procedure Laterality Date  . Ankle fracture surgery      RIGHT  . Colon surgery    . Knee arthroscopy      LEFT  . Total knee arthroplasty Left 11/08/2015  . Total knee arthroplasty Left 11/08/2015    Procedure: TOTAL KNEE ARTHROPLASTY;  Surgeon: Garald Balding, MD;  Location: [facility];  Service: Orthopedics;  Laterality: Left;    There were no vitals filed for this visit.  Visit Diagnosis:  Left knee pain  Swelling of left knee joint  Decreased range of motion of left knee                                 PT Short Term Goals - 12/08/15 0759    PT SHORT TERM GOAL #1   Title Ind with an HEP.   Time 2   Period Weeks   Status Achieved   PT SHORT TERM GOAL #2   Title Full active left knee extension.   Time 2   Period Weeks   Status Achieved  AROM L knee ext 0 deg 12/02/2015           PT Long Term Goals - 12/08/15 0736    PT LONG TERM GOAL #1   Title Active left knee flexion to 125 degrees+ so the patient can perform functional tasks and do so with pain not > 2-3/10.   Time 6   Period Weeks   Status Partially Met  AROM L knee flex 121 deg 12/08/2015   PT LONG TERM GOAL #2   Title Increase left knee strength to a solid 5/5 to provide good stability for accomplishment of functional activities   Time 6   Period Weeks   Status Partially Met  R knee MMT ranging from 4+/5 to 5/5 12/08/2015   PT LONG  TERM GOAL #3   Title Perform a reciprocating stair gait with one railing with pain not > 2-3/10.   Time 6   Period Weeks   Status Achieved   PT LONG TERM GOAL #4   Title Walk a community distance wiht pain not > 2/10.   Status Achieved               Problem List Patient Active Problem List   Diagnosis Date Noted  . Primary osteoarthritis of left knee 11/08/2015  . Parkinson disease, symptomatic ([place]) 11/08/2015  . Hypertension 11/08/2015  . S/P total knee replacement using cement 11/08/2015   PHYSICAL THERAPY DISCHARGE SUMMARY  Visits from Start of Care:   Current functional level related to goals / functional outcomes: Please see above.  Remaining deficits:  D/C summary required per patient request for D/C. MD said he was doing good and okay with patient stopping PT and RTW as long as he doesn't fall on his knee per patient report. AROM L knee 0-121 deg. L knee/hip  strength 4+/5-5/5. FOTO completed with initial limitation 52%, current limitation 23%. Goal and current status: CJ      Education / Equipment: HEP. Plan: Patient agrees to discharge.  Patient goals were partially met. Patient is being discharged due to meeting the stated rehab goals.  ?????      Bradden Tadros, [place] MPT 12/12/2015, 10:13 AM  [facility] [address] [place], [place], [address] Phone: [phone]   Fax:  [phone]  Name: Jeff Greer MRN: [id number] Date of Birth: [birth year]

## 2016-01-02 DIAGNOSIS — I10 Essential (primary) hypertension: Secondary | ICD-10-CM | POA: Diagnosis not present

## 2016-01-02 DIAGNOSIS — J449 Chronic obstructive pulmonary disease, unspecified: Secondary | ICD-10-CM | POA: Diagnosis not present

## 2016-01-02 DIAGNOSIS — G20 Parkinson's disease: Secondary | ICD-10-CM | POA: Diagnosis not present

## 2016-01-02 DIAGNOSIS — Z87891 Personal history of nicotine dependence: Secondary | ICD-10-CM | POA: Diagnosis not present

## 2016-02-14 DIAGNOSIS — I10 Essential (primary) hypertension: Secondary | ICD-10-CM | POA: Diagnosis not present

## 2016-02-14 DIAGNOSIS — Z6828 Body mass index (BMI) 28.0-28.9, adult: Secondary | ICD-10-CM | POA: Diagnosis not present

## 2016-02-14 DIAGNOSIS — J449 Chronic obstructive pulmonary disease, unspecified: Secondary | ICD-10-CM | POA: Diagnosis not present

## 2016-02-14 DIAGNOSIS — B3742 Candidal balanitis: Secondary | ICD-10-CM | POA: Diagnosis not present

## 2016-02-14 DIAGNOSIS — G20 Parkinson's disease: Secondary | ICD-10-CM | POA: Diagnosis not present

## 2016-03-01 DIAGNOSIS — N471 Phimosis: Secondary | ICD-10-CM | POA: Diagnosis not present

## 2016-03-01 DIAGNOSIS — R39198 Other difficulties with micturition: Secondary | ICD-10-CM | POA: Diagnosis not present

## 2016-03-07 DIAGNOSIS — M1712 Unilateral primary osteoarthritis, left knee: Secondary | ICD-10-CM | POA: Diagnosis not present

## 2016-03-08 DIAGNOSIS — Z808 Family history of malignant neoplasm of other organs or systems: Secondary | ICD-10-CM | POA: Diagnosis not present

## 2016-03-08 DIAGNOSIS — M069 Rheumatoid arthritis, unspecified: Secondary | ICD-10-CM | POA: Diagnosis not present

## 2016-03-08 DIAGNOSIS — Z96652 Presence of left artificial knee joint: Secondary | ICD-10-CM | POA: Diagnosis not present

## 2016-03-08 DIAGNOSIS — M1990 Unspecified osteoarthritis, unspecified site: Secondary | ICD-10-CM | POA: Diagnosis not present

## 2016-03-08 DIAGNOSIS — Z801 Family history of malignant neoplasm of trachea, bronchus and lung: Secondary | ICD-10-CM | POA: Diagnosis not present

## 2016-03-08 DIAGNOSIS — I10 Essential (primary) hypertension: Secondary | ICD-10-CM | POA: Diagnosis not present

## 2016-03-08 DIAGNOSIS — R39198 Other difficulties with micturition: Secondary | ICD-10-CM | POA: Diagnosis not present

## 2016-03-08 DIAGNOSIS — Z87891 Personal history of nicotine dependence: Secondary | ICD-10-CM | POA: Diagnosis not present

## 2016-03-08 DIAGNOSIS — N480 Leukoplakia of penis: Secondary | ICD-10-CM | POA: Diagnosis not present

## 2016-03-08 DIAGNOSIS — N481 Balanitis: Secondary | ICD-10-CM | POA: Diagnosis not present

## 2016-03-08 DIAGNOSIS — Z7982 Long term (current) use of aspirin: Secondary | ICD-10-CM | POA: Diagnosis not present

## 2016-03-08 DIAGNOSIS — N471 Phimosis: Secondary | ICD-10-CM | POA: Diagnosis not present

## 2016-03-08 DIAGNOSIS — K219 Gastro-esophageal reflux disease without esophagitis: Secondary | ICD-10-CM | POA: Diagnosis not present

## 2016-03-08 DIAGNOSIS — G20 Parkinson's disease: Secondary | ICD-10-CM | POA: Diagnosis not present

## 2016-03-08 DIAGNOSIS — Z79899 Other long term (current) drug therapy: Secondary | ICD-10-CM | POA: Diagnosis not present

## 2016-03-08 DIAGNOSIS — N475 Adhesions of prepuce and glans penis: Secondary | ICD-10-CM | POA: Diagnosis not present

## 2016-05-25 DIAGNOSIS — N475 Adhesions of prepuce and glans penis: Secondary | ICD-10-CM | POA: Diagnosis not present

## 2016-07-05 DIAGNOSIS — R5383 Other fatigue: Secondary | ICD-10-CM | POA: Diagnosis not present

## 2016-07-05 DIAGNOSIS — I10 Essential (primary) hypertension: Secondary | ICD-10-CM | POA: Diagnosis not present

## 2016-07-05 DIAGNOSIS — Z1389 Encounter for screening for other disorder: Secondary | ICD-10-CM | POA: Diagnosis not present

## 2016-07-05 DIAGNOSIS — Z79899 Other long term (current) drug therapy: Secondary | ICD-10-CM | POA: Diagnosis not present

## 2016-07-05 DIAGNOSIS — Z1211 Encounter for screening for malignant neoplasm of colon: Secondary | ICD-10-CM | POA: Diagnosis not present

## 2016-07-05 DIAGNOSIS — Z125 Encounter for screening for malignant neoplasm of prostate: Secondary | ICD-10-CM | POA: Diagnosis not present

## 2016-07-05 DIAGNOSIS — Z299 Encounter for prophylactic measures, unspecified: Secondary | ICD-10-CM | POA: Diagnosis not present

## 2016-07-05 DIAGNOSIS — Z0000 Encounter for general adult medical examination without abnormal findings: Secondary | ICD-10-CM | POA: Diagnosis not present

## 2016-07-05 DIAGNOSIS — Z7189 Other specified counseling: Secondary | ICD-10-CM | POA: Diagnosis not present

## 2016-10-16 DIAGNOSIS — Z299 Encounter for prophylactic measures, unspecified: Secondary | ICD-10-CM | POA: Diagnosis not present

## 2016-10-16 DIAGNOSIS — J449 Chronic obstructive pulmonary disease, unspecified: Secondary | ICD-10-CM | POA: Diagnosis not present

## 2016-10-16 DIAGNOSIS — Z6827 Body mass index (BMI) 27.0-27.9, adult: Secondary | ICD-10-CM | POA: Diagnosis not present

## 2016-10-16 DIAGNOSIS — K432 Incisional hernia without obstruction or gangrene: Secondary | ICD-10-CM | POA: Diagnosis not present

## 2016-10-16 DIAGNOSIS — I10 Essential (primary) hypertension: Secondary | ICD-10-CM | POA: Diagnosis not present

## 2016-12-27 IMAGING — CR DG CHEST 2V
2 series · 2 of 2 positions shown · non-contrast
Comparison: 12/09/2002.

CLINICAL DATA: Hypertension.  COPD.

EXAM:
CHEST  2 VIEW

[w chest pa]
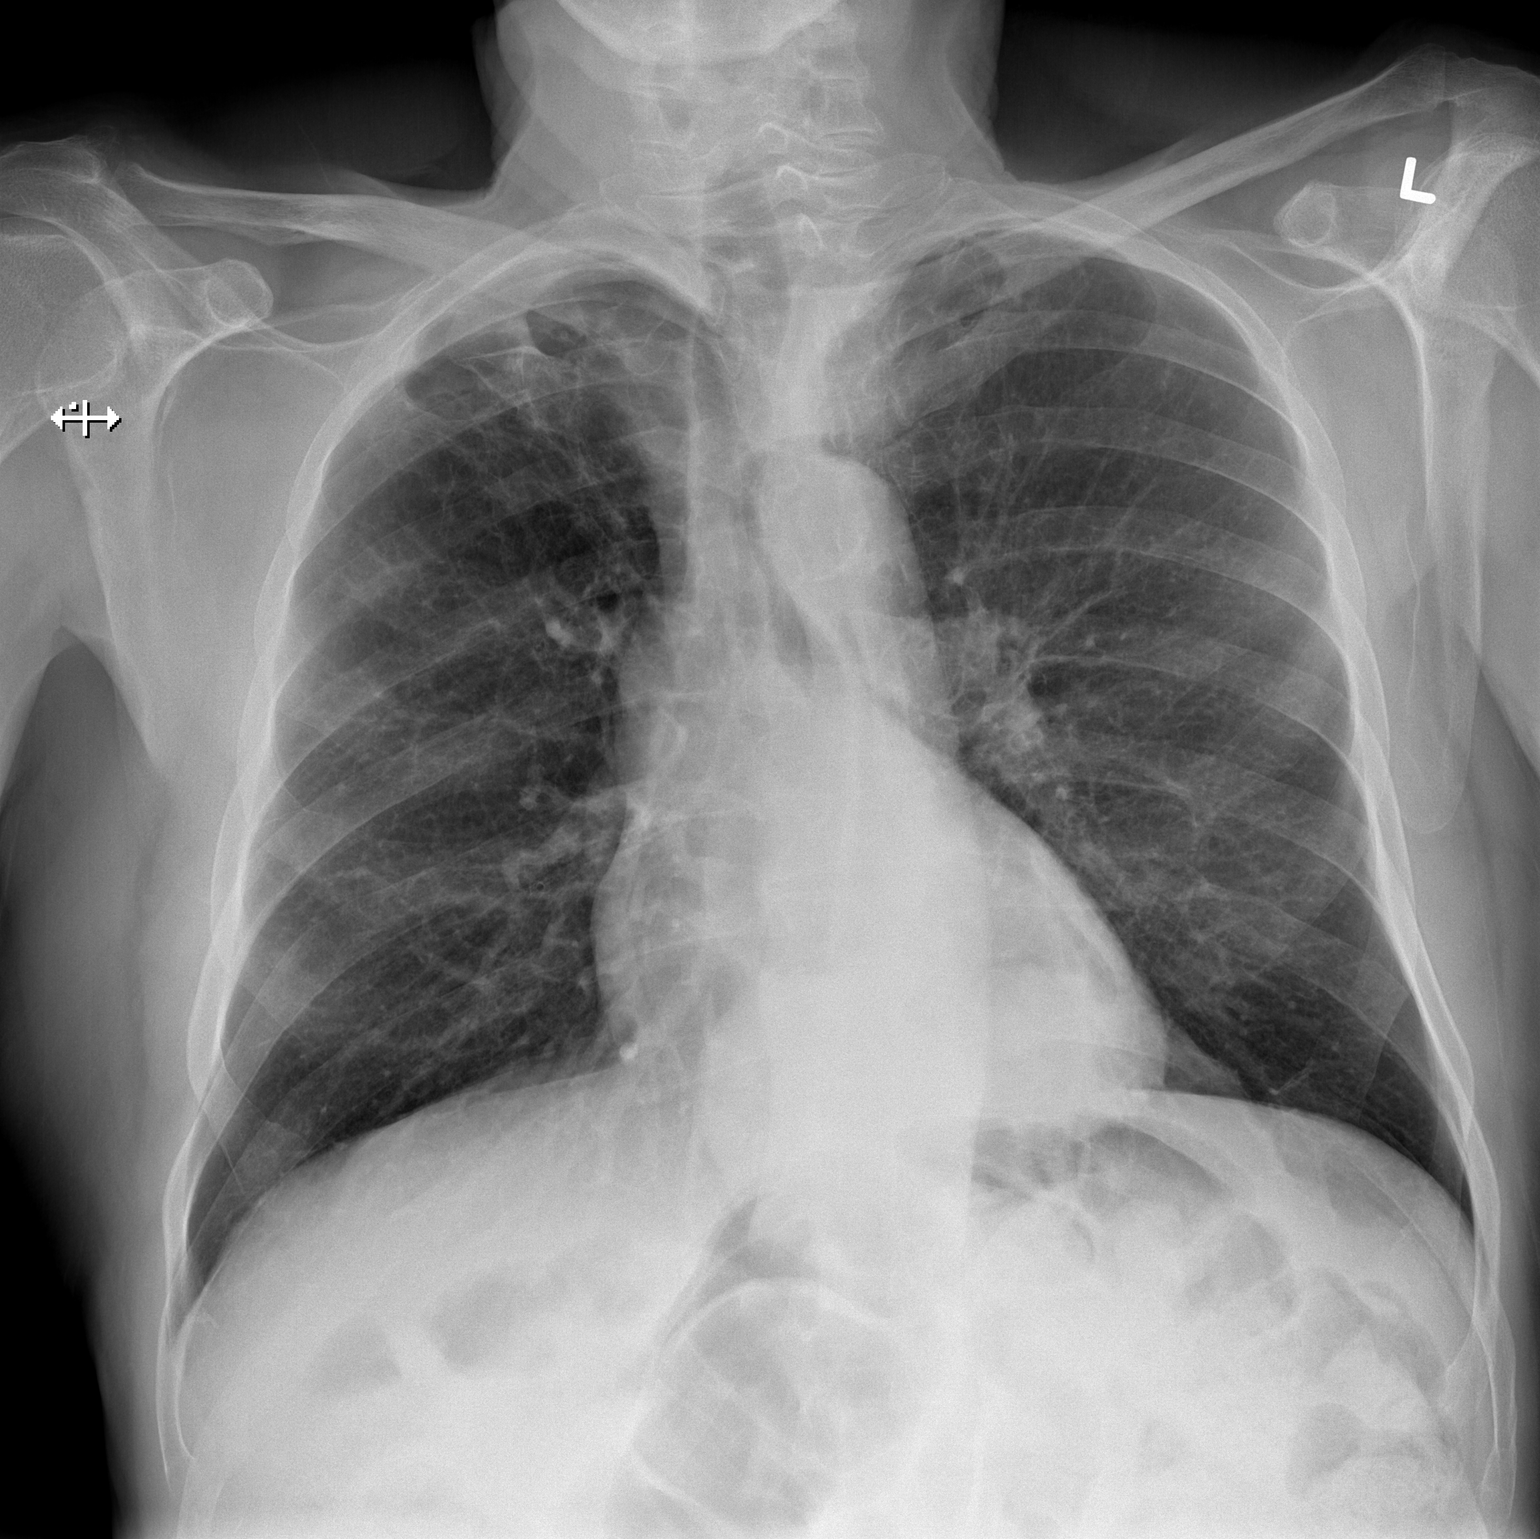

[w chest lat]
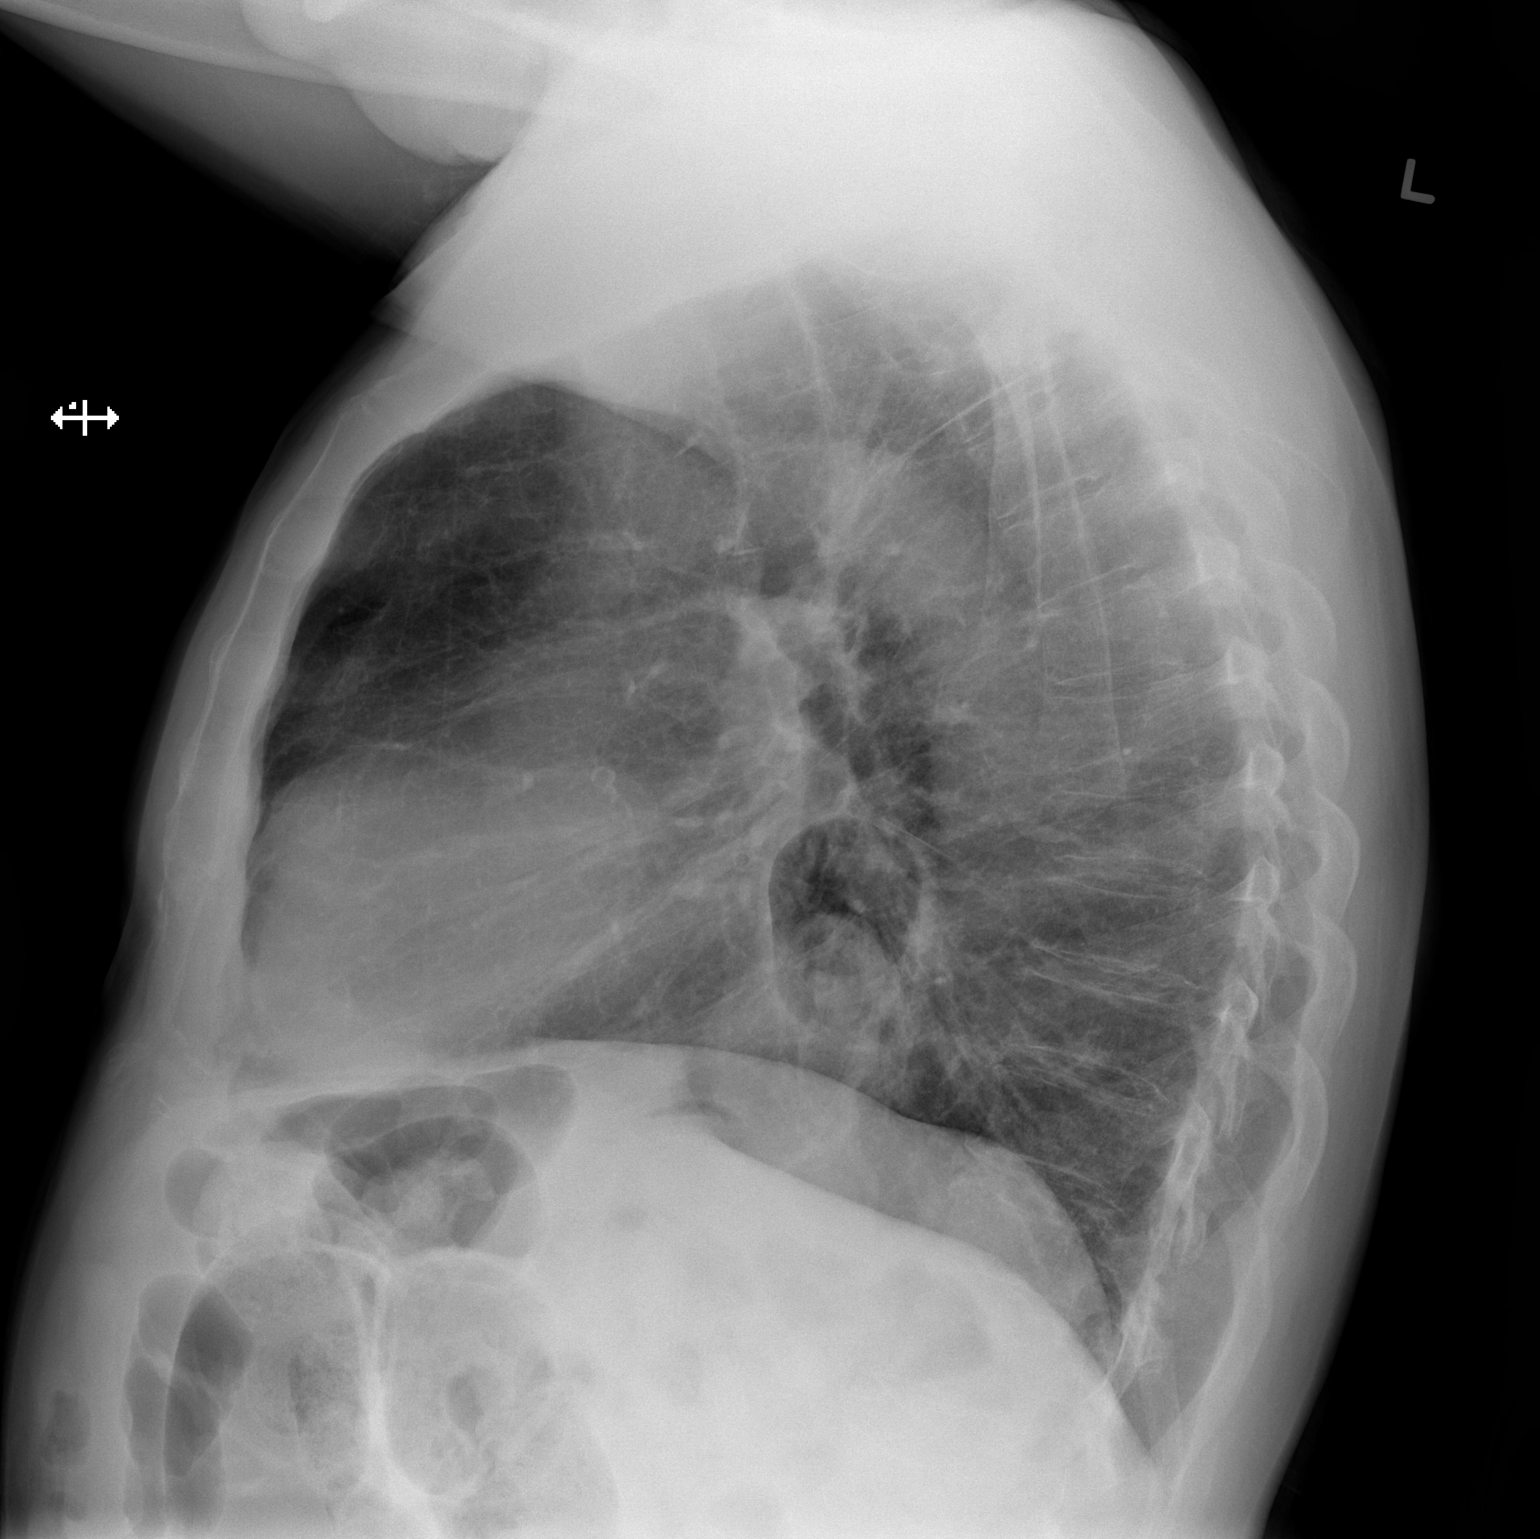

[2 of 2 positions shown; findings below may reference images not displayed]

FINDINGS: Mediastinum and hilar structures are normal. No focal infiltrate.
COPD. Biapical pleural parenchymal thickening noted most consistent
with scarring. Prominent linear scar is in the right upper lobe. No
pleural effusion pneumothorax. Heart size normal. Prominence sliding
hiatal hernia. No acute bony abnormality.
IMPRESSION: 1. COPD and bilateral pleural-parenchymal scarring.
2. Prominent sliding hiatal hernia .

## 2017-03-13 DIAGNOSIS — Z6828 Body mass index (BMI) 28.0-28.9, adult: Secondary | ICD-10-CM | POA: Diagnosis not present

## 2017-03-13 DIAGNOSIS — Z713 Dietary counseling and surveillance: Secondary | ICD-10-CM | POA: Diagnosis not present

## 2017-03-13 DIAGNOSIS — G20 Parkinson's disease: Secondary | ICD-10-CM | POA: Diagnosis not present

## 2017-03-13 DIAGNOSIS — Z87891 Personal history of nicotine dependence: Secondary | ICD-10-CM | POA: Diagnosis not present

## 2017-03-13 DIAGNOSIS — I10 Essential (primary) hypertension: Secondary | ICD-10-CM | POA: Diagnosis not present

## 2017-03-13 DIAGNOSIS — Z299 Encounter for prophylactic measures, unspecified: Secondary | ICD-10-CM | POA: Diagnosis not present

## 2017-03-13 DIAGNOSIS — M1712 Unilateral primary osteoarthritis, left knee: Secondary | ICD-10-CM | POA: Diagnosis not present

## 2017-03-13 DIAGNOSIS — J449 Chronic obstructive pulmonary disease, unspecified: Secondary | ICD-10-CM | POA: Diagnosis not present

## 2017-03-13 DIAGNOSIS — K219 Gastro-esophageal reflux disease without esophagitis: Secondary | ICD-10-CM | POA: Diagnosis not present

## 2017-03-13 DIAGNOSIS — Z2821 Immunization not carried out because of patient refusal: Secondary | ICD-10-CM | POA: Diagnosis not present

## 2017-07-09 DIAGNOSIS — G20 Parkinson's disease: Secondary | ICD-10-CM | POA: Diagnosis not present

## 2017-07-09 DIAGNOSIS — R5383 Other fatigue: Secondary | ICD-10-CM | POA: Diagnosis not present

## 2017-07-09 DIAGNOSIS — J449 Chronic obstructive pulmonary disease, unspecified: Secondary | ICD-10-CM | POA: Diagnosis not present

## 2017-07-09 DIAGNOSIS — Z125 Encounter for screening for malignant neoplasm of prostate: Secondary | ICD-10-CM | POA: Diagnosis not present

## 2017-07-09 DIAGNOSIS — Z6826 Body mass index (BMI) 26.0-26.9, adult: Secondary | ICD-10-CM | POA: Diagnosis not present

## 2017-07-09 DIAGNOSIS — Z1211 Encounter for screening for malignant neoplasm of colon: Secondary | ICD-10-CM | POA: Diagnosis not present

## 2017-07-09 DIAGNOSIS — Z1389 Encounter for screening for other disorder: Secondary | ICD-10-CM | POA: Diagnosis not present

## 2017-07-09 DIAGNOSIS — Z299 Encounter for prophylactic measures, unspecified: Secondary | ICD-10-CM | POA: Diagnosis not present

## 2017-07-09 DIAGNOSIS — Z7189 Other specified counseling: Secondary | ICD-10-CM | POA: Diagnosis not present

## 2017-07-09 DIAGNOSIS — Z0000 Encounter for general adult medical examination without abnormal findings: Secondary | ICD-10-CM | POA: Diagnosis not present

## 2017-07-09 DIAGNOSIS — I10 Essential (primary) hypertension: Secondary | ICD-10-CM | POA: Diagnosis not present

## 2017-07-09 DIAGNOSIS — Z79899 Other long term (current) drug therapy: Secondary | ICD-10-CM | POA: Diagnosis not present

## 2018-01-09 DIAGNOSIS — Z6827 Body mass index (BMI) 27.0-27.9, adult: Secondary | ICD-10-CM | POA: Diagnosis not present

## 2018-01-09 DIAGNOSIS — Z713 Dietary counseling and surveillance: Secondary | ICD-10-CM | POA: Diagnosis not present

## 2018-01-09 DIAGNOSIS — G20 Parkinson's disease: Secondary | ICD-10-CM | POA: Diagnosis not present

## 2018-01-09 DIAGNOSIS — I10 Essential (primary) hypertension: Secondary | ICD-10-CM | POA: Diagnosis not present

## 2018-01-09 DIAGNOSIS — Z299 Encounter for prophylactic measures, unspecified: Secondary | ICD-10-CM | POA: Diagnosis not present

## 2018-04-16 DIAGNOSIS — J449 Chronic obstructive pulmonary disease, unspecified: Secondary | ICD-10-CM | POA: Diagnosis not present

## 2018-04-16 DIAGNOSIS — M159 Polyosteoarthritis, unspecified: Secondary | ICD-10-CM | POA: Diagnosis not present

## 2018-04-16 DIAGNOSIS — I10 Essential (primary) hypertension: Secondary | ICD-10-CM | POA: Diagnosis not present

## 2018-07-10 DIAGNOSIS — Z1339 Encounter for screening examination for other mental health and behavioral disorders: Secondary | ICD-10-CM | POA: Diagnosis not present

## 2018-07-10 DIAGNOSIS — Z6826 Body mass index (BMI) 26.0-26.9, adult: Secondary | ICD-10-CM | POA: Diagnosis not present

## 2018-07-10 DIAGNOSIS — Z0000 Encounter for general adult medical examination without abnormal findings: Secondary | ICD-10-CM | POA: Diagnosis not present

## 2018-07-10 DIAGNOSIS — Z125 Encounter for screening for malignant neoplasm of prostate: Secondary | ICD-10-CM | POA: Diagnosis not present

## 2018-07-10 DIAGNOSIS — I10 Essential (primary) hypertension: Secondary | ICD-10-CM | POA: Diagnosis not present

## 2018-07-10 DIAGNOSIS — J449 Chronic obstructive pulmonary disease, unspecified: Secondary | ICD-10-CM | POA: Diagnosis not present

## 2018-07-10 DIAGNOSIS — Z299 Encounter for prophylactic measures, unspecified: Secondary | ICD-10-CM | POA: Diagnosis not present

## 2018-07-10 DIAGNOSIS — R5383 Other fatigue: Secondary | ICD-10-CM | POA: Diagnosis not present

## 2018-07-10 DIAGNOSIS — Z7189 Other specified counseling: Secondary | ICD-10-CM | POA: Diagnosis not present

## 2018-07-10 DIAGNOSIS — Z1331 Encounter for screening for depression: Secondary | ICD-10-CM | POA: Diagnosis not present

## 2018-07-10 DIAGNOSIS — Z1211 Encounter for screening for malignant neoplasm of colon: Secondary | ICD-10-CM | POA: Diagnosis not present

## 2018-07-10 DIAGNOSIS — Z79899 Other long term (current) drug therapy: Secondary | ICD-10-CM | POA: Diagnosis not present

## 2018-09-08 DIAGNOSIS — W228XXA Striking against or struck by other objects, initial encounter: Secondary | ICD-10-CM | POA: Diagnosis not present

## 2018-09-08 DIAGNOSIS — Z79899 Other long term (current) drug therapy: Secondary | ICD-10-CM | POA: Diagnosis not present

## 2018-09-08 DIAGNOSIS — M1990 Unspecified osteoarthritis, unspecified site: Secondary | ICD-10-CM | POA: Diagnosis not present

## 2018-09-08 DIAGNOSIS — S0121XA Laceration without foreign body of nose, initial encounter: Secondary | ICD-10-CM | POA: Diagnosis not present

## 2018-09-08 DIAGNOSIS — S51011A Laceration without foreign body of right elbow, initial encounter: Secondary | ICD-10-CM | POA: Diagnosis not present

## 2018-09-08 DIAGNOSIS — I10 Essential (primary) hypertension: Secondary | ICD-10-CM | POA: Diagnosis not present

## 2018-09-08 DIAGNOSIS — Z87891 Personal history of nicotine dependence: Secondary | ICD-10-CM | POA: Diagnosis not present

## 2018-09-08 DIAGNOSIS — S01112A Laceration without foreign body of left eyelid and periocular area, initial encounter: Secondary | ICD-10-CM | POA: Diagnosis not present

## 2018-09-08 DIAGNOSIS — Z23 Encounter for immunization: Secondary | ICD-10-CM | POA: Diagnosis not present

## 2018-09-08 DIAGNOSIS — K219 Gastro-esophageal reflux disease without esophagitis: Secondary | ICD-10-CM | POA: Diagnosis not present

## 2018-09-08 DIAGNOSIS — Z7982 Long term (current) use of aspirin: Secondary | ICD-10-CM | POA: Diagnosis not present

## 2018-09-08 DIAGNOSIS — G20 Parkinson's disease: Secondary | ICD-10-CM | POA: Diagnosis not present

## 2019-01-09 DIAGNOSIS — G20 Parkinson's disease: Secondary | ICD-10-CM | POA: Diagnosis not present

## 2019-01-09 DIAGNOSIS — L84 Corns and callosities: Secondary | ICD-10-CM | POA: Diagnosis not present

## 2019-01-09 DIAGNOSIS — I10 Essential (primary) hypertension: Secondary | ICD-10-CM | POA: Diagnosis not present

## 2019-01-09 DIAGNOSIS — Z6827 Body mass index (BMI) 27.0-27.9, adult: Secondary | ICD-10-CM | POA: Diagnosis not present

## 2019-01-09 DIAGNOSIS — J449 Chronic obstructive pulmonary disease, unspecified: Secondary | ICD-10-CM | POA: Diagnosis not present

## 2019-01-09 DIAGNOSIS — Z299 Encounter for prophylactic measures, unspecified: Secondary | ICD-10-CM | POA: Diagnosis not present

## 2019-02-09 DIAGNOSIS — J449 Chronic obstructive pulmonary disease, unspecified: Secondary | ICD-10-CM | POA: Diagnosis not present

## 2019-07-16 DIAGNOSIS — Z299 Encounter for prophylactic measures, unspecified: Secondary | ICD-10-CM | POA: Diagnosis not present

## 2019-07-16 DIAGNOSIS — I10 Essential (primary) hypertension: Secondary | ICD-10-CM | POA: Diagnosis not present

## 2019-07-16 DIAGNOSIS — R5383 Other fatigue: Secondary | ICD-10-CM | POA: Diagnosis not present

## 2019-07-16 DIAGNOSIS — Z6826 Body mass index (BMI) 26.0-26.9, adult: Secondary | ICD-10-CM | POA: Diagnosis not present

## 2019-07-16 DIAGNOSIS — Z79899 Other long term (current) drug therapy: Secondary | ICD-10-CM | POA: Diagnosis not present

## 2019-07-16 DIAGNOSIS — Z1331 Encounter for screening for depression: Secondary | ICD-10-CM | POA: Diagnosis not present

## 2019-07-16 DIAGNOSIS — Z0000 Encounter for general adult medical examination without abnormal findings: Secondary | ICD-10-CM | POA: Diagnosis not present

## 2019-07-16 DIAGNOSIS — Z1339 Encounter for screening examination for other mental health and behavioral disorders: Secondary | ICD-10-CM | POA: Diagnosis not present

## 2019-07-16 DIAGNOSIS — Z7189 Other specified counseling: Secondary | ICD-10-CM | POA: Diagnosis not present

## 2019-07-16 DIAGNOSIS — Z125 Encounter for screening for malignant neoplasm of prostate: Secondary | ICD-10-CM | POA: Diagnosis not present

## 2019-07-16 DIAGNOSIS — J449 Chronic obstructive pulmonary disease, unspecified: Secondary | ICD-10-CM | POA: Diagnosis not present

## 2020-01-19 DIAGNOSIS — I10 Essential (primary) hypertension: Secondary | ICD-10-CM | POA: Diagnosis not present

## 2020-01-19 DIAGNOSIS — G20 Parkinson's disease: Secondary | ICD-10-CM | POA: Diagnosis not present

## 2020-01-19 DIAGNOSIS — Z2821 Immunization not carried out because of patient refusal: Secondary | ICD-10-CM | POA: Diagnosis not present

## 2020-01-19 DIAGNOSIS — Z299 Encounter for prophylactic measures, unspecified: Secondary | ICD-10-CM | POA: Diagnosis not present

## 2020-01-19 DIAGNOSIS — J449 Chronic obstructive pulmonary disease, unspecified: Secondary | ICD-10-CM | POA: Diagnosis not present

## 2020-01-19 DIAGNOSIS — Z87891 Personal history of nicotine dependence: Secondary | ICD-10-CM | POA: Diagnosis not present

## 2020-01-19 DIAGNOSIS — Z6827 Body mass index (BMI) 27.0-27.9, adult: Secondary | ICD-10-CM | POA: Diagnosis not present

## 2020-02-16 DIAGNOSIS — I10 Essential (primary) hypertension: Secondary | ICD-10-CM | POA: Diagnosis not present

## 2020-02-22 DIAGNOSIS — Z2821 Immunization not carried out because of patient refusal: Secondary | ICD-10-CM | POA: Diagnosis not present

## 2020-02-22 DIAGNOSIS — I10 Essential (primary) hypertension: Secondary | ICD-10-CM | POA: Diagnosis not present

## 2020-02-22 DIAGNOSIS — G20 Parkinson's disease: Secondary | ICD-10-CM | POA: Diagnosis not present

## 2020-02-22 DIAGNOSIS — Z299 Encounter for prophylactic measures, unspecified: Secondary | ICD-10-CM | POA: Diagnosis not present

## 2020-02-22 DIAGNOSIS — Z6826 Body mass index (BMI) 26.0-26.9, adult: Secondary | ICD-10-CM | POA: Diagnosis not present

## 2020-02-22 DIAGNOSIS — J449 Chronic obstructive pulmonary disease, unspecified: Secondary | ICD-10-CM | POA: Diagnosis not present

## 2020-03-18 DIAGNOSIS — I10 Essential (primary) hypertension: Secondary | ICD-10-CM | POA: Diagnosis not present

## 2020-04-17 DIAGNOSIS — I10 Essential (primary) hypertension: Secondary | ICD-10-CM | POA: Diagnosis not present

## 2020-05-18 DIAGNOSIS — I10 Essential (primary) hypertension: Secondary | ICD-10-CM | POA: Diagnosis not present

## 2020-06-03 ENCOUNTER — Encounter: Payer: Self-pay | Admitting: Neurology

## 2020-06-03 DIAGNOSIS — J449 Chronic obstructive pulmonary disease, unspecified: Secondary | ICD-10-CM | POA: Diagnosis not present

## 2020-06-03 DIAGNOSIS — M549 Dorsalgia, unspecified: Secondary | ICD-10-CM | POA: Diagnosis not present

## 2020-06-03 DIAGNOSIS — G20 Parkinson's disease: Secondary | ICD-10-CM | POA: Diagnosis not present

## 2020-06-03 DIAGNOSIS — Z299 Encounter for prophylactic measures, unspecified: Secondary | ICD-10-CM | POA: Diagnosis not present

## 2020-06-03 DIAGNOSIS — I10 Essential (primary) hypertension: Secondary | ICD-10-CM | POA: Diagnosis not present

## 2020-06-11 DIAGNOSIS — Z23 Encounter for immunization: Secondary | ICD-10-CM | POA: Diagnosis not present

## 2020-06-17 DIAGNOSIS — I10 Essential (primary) hypertension: Secondary | ICD-10-CM | POA: Diagnosis not present

## 2020-07-09 DIAGNOSIS — Z23 Encounter for immunization: Secondary | ICD-10-CM | POA: Diagnosis not present

## 2020-07-19 DIAGNOSIS — I10 Essential (primary) hypertension: Secondary | ICD-10-CM | POA: Diagnosis not present

## 2020-07-20 DIAGNOSIS — Z1331 Encounter for screening for depression: Secondary | ICD-10-CM | POA: Diagnosis not present

## 2020-07-20 DIAGNOSIS — Z7189 Other specified counseling: Secondary | ICD-10-CM | POA: Diagnosis not present

## 2020-07-20 DIAGNOSIS — Z1339 Encounter for screening examination for other mental health and behavioral disorders: Secondary | ICD-10-CM | POA: Diagnosis not present

## 2020-07-20 DIAGNOSIS — Z125 Encounter for screening for malignant neoplasm of prostate: Secondary | ICD-10-CM | POA: Diagnosis not present

## 2020-07-20 DIAGNOSIS — Z6825 Body mass index (BMI) 25.0-25.9, adult: Secondary | ICD-10-CM | POA: Diagnosis not present

## 2020-07-20 DIAGNOSIS — Z79899 Other long term (current) drug therapy: Secondary | ICD-10-CM | POA: Diagnosis not present

## 2020-07-20 DIAGNOSIS — Z299 Encounter for prophylactic measures, unspecified: Secondary | ICD-10-CM | POA: Diagnosis not present

## 2020-07-20 DIAGNOSIS — J449 Chronic obstructive pulmonary disease, unspecified: Secondary | ICD-10-CM | POA: Diagnosis not present

## 2020-07-20 DIAGNOSIS — G20 Parkinson's disease: Secondary | ICD-10-CM | POA: Diagnosis not present

## 2020-07-20 DIAGNOSIS — Z0000 Encounter for general adult medical examination without abnormal findings: Secondary | ICD-10-CM | POA: Diagnosis not present

## 2020-07-20 DIAGNOSIS — R5383 Other fatigue: Secondary | ICD-10-CM | POA: Diagnosis not present

## 2020-08-18 DIAGNOSIS — I10 Essential (primary) hypertension: Secondary | ICD-10-CM | POA: Diagnosis not present
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
## 2020-08-30 ENCOUNTER — Encounter: Payer: Self-pay | Admitting: Neurology

## 2020-08-30 ENCOUNTER — Ambulatory Visit (INDEPENDENT_AMBULATORY_CARE_PROVIDER_SITE_OTHER): Payer: Medicare Other | Admitting: Neurology

## 2020-08-30 ENCOUNTER — Other Ambulatory Visit: Payer: Self-pay

## 2020-08-30 VITALS — BP 162/82 | HR 67 | Ht 69.0 in | Wt 168.0 lb

## 2020-08-30 DIAGNOSIS — R200 Anesthesia of skin: Secondary | ICD-10-CM | POA: Diagnosis not present

## 2020-08-30 DIAGNOSIS — M5442 Lumbago with sciatica, left side: Secondary | ICD-10-CM

## 2020-08-30 DIAGNOSIS — M5441 Lumbago with sciatica, right side: Secondary | ICD-10-CM

## 2020-08-30 MED ORDER — CARBIDOPA-LEVODOPA 25-100 MG PO TABS: 1.5000 | ORAL_TABLET | Freq: Three times a day (TID) | ORAL | 5 refills | Status: DC

## 2020-08-30 NOTE — Patient Instructions (Addendum)
1.  I increased carbidopa-levodopa to 1 1/2 tablets three times daily 2.  Will check MRI of brain with and without. We have sent a referral to [facility] for your MRI and they will call you directly to schedule your appointment. They are located at [place]. If you need to contact them directly please call [phone].  3.  Will check nerve study of lower extremities 4.  Follow up 4 to 6 months.

## 2020-09-17 DIAGNOSIS — I10 Essential (primary) hypertension: Secondary | ICD-10-CM | POA: Diagnosis not present

## 2020-09-18 ENCOUNTER — Other Ambulatory Visit: Payer: Self-pay

## 2020-09-18 ENCOUNTER — Ambulatory Visit
Admission: RE | Admit: 2020-09-18 | Discharge: 2020-09-18 | Disposition: A | Discharge status: Home / Self Care | Payer: Medicare Other | Source: Ambulatory Visit | Attending: Neurology | Admitting: Neurology

## 2020-09-18 DIAGNOSIS — R202 Paresthesia of skin: Secondary | ICD-10-CM | POA: Diagnosis not present

## 2020-09-18 DIAGNOSIS — G20 Parkinson's disease: Secondary | ICD-10-CM | POA: Diagnosis not present

## 2020-09-18 DIAGNOSIS — R200 Anesthesia of skin: Secondary | ICD-10-CM

## 2020-09-18 DIAGNOSIS — J320 Chronic maxillary sinusitis: Secondary | ICD-10-CM | POA: Diagnosis not present

## 2020-09-18 MED ORDER — GADOBENATE DIMEGLUMINE 529 MG/ML IV SOLN
15.0000 mL | Freq: Once | INTRAVENOUS | Status: AC | PRN
  Administered 2020-09-18: 15 mL via INTRAVENOUS

## 2020-09-21 ENCOUNTER — Telehealth: Payer: Self-pay | Admitting: Neurology

## 2020-09-21 NOTE — Telephone Encounter (Signed)
Patient's contact Cathy left message returning call for MRI results.

## 2020-09-21 NOTE — Telephone Encounter (Signed)
aware

## 2020-10-11 ENCOUNTER — Ambulatory Visit (INDEPENDENT_AMBULATORY_CARE_PROVIDER_SITE_OTHER): Payer: Medicare Other | Admitting: Neurology

## 2020-10-11 ENCOUNTER — Other Ambulatory Visit: Payer: Self-pay

## 2020-10-11 DIAGNOSIS — R200 Anesthesia of skin: Secondary | ICD-10-CM | POA: Diagnosis not present

## 2020-10-11 DIAGNOSIS — M5417 Radiculopathy, lumbosacral region: Secondary | ICD-10-CM

## 2020-10-11 DIAGNOSIS — M5441 Lumbago with sciatica, right side: Secondary | ICD-10-CM

## 2020-10-11 NOTE — Procedures (Signed)
[facility] Neurology  [place], [place]  [place], [place] [address] Tel: [phone] Fax:  [phone] Test Date:  10/11/2020  Patient: Jeff Greer DOB: [birth year] Physician: Narda Amber, DO  Sex: Male Height: 5\' 9"  Ref Phys: Metta Clines, D.O.  ID#: [id number]   Technician:    Patient Complaints: This is a 74 year old man referred for evaluation of bilateral leg numbness.  NCV & EMG Findings: Extensive electrodiagnostic testing of the right lower extremity and additional studies of the left shows:  1. Bilateral sural and superficial peroneal sensory responses are within normal limits. 2. Bilateral peroneal and tibial motor responses are within normal limits. 3. Bilateral tibial H reflex studies are within normal limits. 4. Chronic motor axonal loss changes are seen affecting the L5-S1 myotome bilaterally, without accompanied active denervation.  Proximal and deep muscles were unable to be tested as the patient is on anticoagulation therapy.  Impression: 1. The electrophysiologic findings are most suggestive of a chronic L5-S1 radiculopathy affecting bilateral lower extremities, moderate. 2. In particular, the there is no evidence of a sensorimotor polyneuropathy affecting the lower extremities.   ___________________________ Narda Amber, DO    Nerve Conduction Studies Anti Sensory Summary Table   Stim Site NR Peak (ms) Norm Peak (ms) P-T Amp (V) Norm P-T Amp  Left Sup Peroneal Anti Sensory (Ant Lat Mall)  32C  12 cm    3.3 <4.6 9.4 >3  Right Sup Peroneal Anti Sensory (Ant Lat Mall)  32C  12 cm    3.1 <4.6 8.4 >3  Left Sural Anti Sensory (Lat Mall)  32C  Calf    3.1 <4.6 16.4 >3  Right Sural Anti Sensory (Lat Mall)  32C  Calf    2.5 <4.6 19.2 >3   Motor Summary Table   Stim Site NR Onset (ms) Norm Onset (ms) O-P Amp (mV) Norm O-P Amp Site1 Site2 Delta-0 (ms) Dist (cm) Vel (m/s) Norm Vel (m/s)  Left Peroneal Motor (Ext Dig Brev)  32C  Ankle    4.2  <6.0 4.3 >2.5 B Fib Ankle 7.4 35.0 47 >40  B Fib    11.6  3.2  Poplt B Fib 2.0 8.0 40 >40  Poplt    13.6  2.9         Right Peroneal Motor (Ext Dig Brev)  32C  Ankle    4.0 <6.0 5.1 >2.5 B Fib Ankle 7.3 36.0 49 >40  B Fib    11.3  4.6  Poplt B Fib 2.0 9.0 45 >40  Poplt    13.3  4.1         Left Tibial Motor (Abd Hall Brev)  32C  Ankle    4.4 <6.0 8.1 >4 Knee Ankle 9.0 38.0 42 >40  Knee    13.4  6.4         Right Tibial Motor (Abd Hall Brev)  32C  Ankle    3.9 <6.0 9.8 >4 Knee Ankle 10.2 41.0 40 >40  Knee    14.1  7.3          H Reflex Studies   NR H-Lat (ms) Lat Norm (ms) L-R H-Lat (ms)  Left Tibial (Gastroc)  32C     30.07 <35 3.27  Right Tibial (Gastroc)  32C     33.33 <35 3.27   EMG   Side Muscle Ins Act Fibs Psw Fasc Number Recrt Dur Dur. Amp Amp. Poly Poly. Comment  Right BicepsFemS Nml Nml Nml Nml 1- Rapid Some 1+  Some 1+ Some 1+ N/A  Right Gastroc Nml Nml Nml Nml 2- Rapid Some 1+ Some 1+ Some 1+ N/A  Right AntTibialis Nml Nml Nml Nml 1- Rapid Many 1+ Many 1+ Many 1+ N/A  Right RectFemoris Nml Nml Nml Nml Nml Nml Nml Nml Nml Nml Nml Nml N/A  Left Gastroc Nml Nml Nml Nml 2- Rapid Some 1+ Some 1+ Some 1+ N/A  Left BicepsFemS Nml Nml Nml Nml 1- Rapid Some 1+ Some 1+ Some 1+ N/A  Left AntTibialis Nml Nml Nml Nml 1- Rapid Many 1+ Many 1+ Many 1+ N/A  Left RectFemoris Nml Nml Nml Nml Nml Nml Nml Nml Nml Nml Nml Nml N/A      Waveforms:

## 2020-10-12 ENCOUNTER — Telehealth: Payer: Self-pay | Admitting: Neurology

## 2020-10-12 ENCOUNTER — Telehealth: Payer: Self-pay

## 2020-10-12 DIAGNOSIS — M4807 Spinal stenosis, lumbosacral region: Secondary | ICD-10-CM

## 2020-10-12 NOTE — Telephone Encounter (Signed)
Patient's contact, Jeff Greer, called and left a message stating she was returning a call about results from the visit for a nerve conduction test yesterday.

## 2020-10-12 NOTE — Telephone Encounter (Signed)
-----   Message from Pieter Partridge, DO sent at 10/11/2020  3:11 PM EST ----- Nerve study shows evidence of possible pinched nerves in the back.  For further evaluation of lumbar spinal stenosis, would like MRI of lumbar spine without contrast.

## 2020-10-12 NOTE — Telephone Encounter (Signed)
Order added per DR.Jaffe. Pt wife advised of his EMG results

## 2020-10-17 NOTE — Telephone Encounter (Signed)
Please see result note with the EMG with my results and recommendations.  Jeff Greer left voice message.

## 2020-10-18 DIAGNOSIS — R002 Palpitations: Secondary | ICD-10-CM | POA: Diagnosis not present

## 2020-10-18 DIAGNOSIS — R202 Paresthesia of skin: Secondary | ICD-10-CM | POA: Diagnosis not present

## 2020-10-18 DIAGNOSIS — I10 Essential (primary) hypertension: Secondary | ICD-10-CM | POA: Diagnosis not present

## 2020-10-18 DIAGNOSIS — G20 Parkinson's disease: Secondary | ICD-10-CM | POA: Diagnosis not present

## 2020-11-06 ENCOUNTER — Ambulatory Visit
Admission: RE | Admit: 2020-11-06 | Discharge: 2020-11-06 | Disposition: A | Discharge status: Home / Self Care | Payer: Medicare Other | Source: Ambulatory Visit | Attending: Neurology | Admitting: Neurology

## 2020-11-06 DIAGNOSIS — M48061 Spinal stenosis, lumbar region without neurogenic claudication: Secondary | ICD-10-CM | POA: Diagnosis not present

## 2020-11-06 DIAGNOSIS — M4807 Spinal stenosis, lumbosacral region: Secondary | ICD-10-CM

## 2020-11-06 DIAGNOSIS — M545 Low back pain, unspecified: Secondary | ICD-10-CM | POA: Diagnosis not present

## 2020-11-07 NOTE — Progress Notes (Signed)
LMOVm for pt to call back

## 2020-11-10 ENCOUNTER — Telehealth: Payer: Self-pay

## 2020-11-10 DIAGNOSIS — M4807 Spinal stenosis, lumbosacral region: Secondary | ICD-10-CM

## 2020-11-10 NOTE — Telephone Encounter (Signed)
-----   Message from Pieter Partridge, DO sent at 11/07/2020  8:22 AM EST ----- MRI of lumbar spine shows arthritis likely pressing on nerves, which likely is the cause of pain and numbness in the legs.  I would like to refer him to neurosurgery for evaluation.

## 2020-11-10 NOTE — Telephone Encounter (Signed)
Spoke to pt daughter in regards to pt MRI results.  Referral for NeuroSurgery. Added per daughter add in  comments to call her to schedule.

## 2020-11-15 DIAGNOSIS — J449 Chronic obstructive pulmonary disease, unspecified: Secondary | ICD-10-CM | POA: Diagnosis not present

## 2020-11-15 DIAGNOSIS — G20 Parkinson's disease: Secondary | ICD-10-CM | POA: Diagnosis not present

## 2020-11-15 DIAGNOSIS — Z23 Encounter for immunization: Secondary | ICD-10-CM | POA: Diagnosis not present

## 2020-11-15 DIAGNOSIS — Z299 Encounter for prophylactic measures, unspecified: Secondary | ICD-10-CM | POA: Diagnosis not present

## 2020-11-15 DIAGNOSIS — I10 Essential (primary) hypertension: Secondary | ICD-10-CM | POA: Diagnosis not present

## 2020-11-17 DIAGNOSIS — I10 Essential (primary) hypertension: Secondary | ICD-10-CM | POA: Diagnosis not present

## 2020-11-24 DIAGNOSIS — Z6829 Body mass index (BMI) 29.0-29.9, adult: Secondary | ICD-10-CM | POA: Diagnosis not present

## 2020-11-24 DIAGNOSIS — I10 Essential (primary) hypertension: Secondary | ICD-10-CM | POA: Diagnosis not present

## 2020-11-24 DIAGNOSIS — M48062 Spinal stenosis, lumbar region with neurogenic claudication: Secondary | ICD-10-CM | POA: Diagnosis not present

## 2020-11-29 DIAGNOSIS — I10 Essential (primary) hypertension: Secondary | ICD-10-CM | POA: Diagnosis not present

## 2020-11-29 DIAGNOSIS — Z6828 Body mass index (BMI) 28.0-28.9, adult: Secondary | ICD-10-CM | POA: Diagnosis not present

## 2020-11-29 DIAGNOSIS — M48062 Spinal stenosis, lumbar region with neurogenic claudication: Secondary | ICD-10-CM | POA: Diagnosis not present

## 2020-12-07 DIAGNOSIS — M48062 Spinal stenosis, lumbar region with neurogenic claudication: Secondary | ICD-10-CM | POA: Diagnosis not present

## 2020-12-19 DIAGNOSIS — I10 Essential (primary) hypertension: Secondary | ICD-10-CM | POA: Diagnosis not present

## 2021-01-16 DIAGNOSIS — I10 Essential (primary) hypertension: Secondary | ICD-10-CM | POA: Diagnosis not present

## 2021-01-21 DIAGNOSIS — Z23 Encounter for immunization: Secondary | ICD-10-CM | POA: Diagnosis not present
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
## 2021-01-26 ENCOUNTER — Ambulatory Visit: Payer: Medicare Other | Admitting: Neurology

## 2021-02-16 DIAGNOSIS — I10 Essential (primary) hypertension: Secondary | ICD-10-CM | POA: Diagnosis not present

## 2021-03-17 DIAGNOSIS — I10 Essential (primary) hypertension: Secondary | ICD-10-CM | POA: Diagnosis not present
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
## 2021-03-21 ENCOUNTER — Encounter: Payer: Self-pay | Admitting: Neurology

## 2021-03-21 ENCOUNTER — Ambulatory Visit (INDEPENDENT_AMBULATORY_CARE_PROVIDER_SITE_OTHER): Payer: Medicare Other | Admitting: Neurology

## 2021-03-21 ENCOUNTER — Other Ambulatory Visit: Payer: Self-pay

## 2021-03-21 VITALS — BP 140/74 | HR 65 | Ht 65.0 in | Wt 164.4 lb

## 2021-03-21 DIAGNOSIS — R200 Anesthesia of skin: Secondary | ICD-10-CM

## 2021-03-21 DIAGNOSIS — G20 Parkinson's disease: Secondary | ICD-10-CM | POA: Diagnosis not present

## 2021-03-21 DIAGNOSIS — M4807 Spinal stenosis, lumbosacral region: Secondary | ICD-10-CM

## 2021-03-21 NOTE — Patient Instructions (Addendum)
1.  Stop trihexyphenidyl 2.  Continue carbidopa-levodopa 1 1/2 tablets three times daily 3.  Contact Dr. Brien Few for another injection 4.  Follow up 6 months

## 2021-04-05 DIAGNOSIS — M48062 Spinal stenosis, lumbar region with neurogenic claudication: Secondary | ICD-10-CM | POA: Diagnosis not present

## 2021-05-17 DIAGNOSIS — J449 Chronic obstructive pulmonary disease, unspecified: Secondary | ICD-10-CM | POA: Diagnosis not present

## 2021-05-17 DIAGNOSIS — Z6825 Body mass index (BMI) 25.0-25.9, adult: Secondary | ICD-10-CM | POA: Diagnosis not present

## 2021-05-17 DIAGNOSIS — G20 Parkinson's disease: Secondary | ICD-10-CM | POA: Diagnosis not present

## 2021-05-17 DIAGNOSIS — M549 Dorsalgia, unspecified: Secondary | ICD-10-CM | POA: Diagnosis not present

## 2021-05-17 DIAGNOSIS — Z87891 Personal history of nicotine dependence: Secondary | ICD-10-CM | POA: Diagnosis not present

## 2021-05-17 DIAGNOSIS — I10 Essential (primary) hypertension: Secondary | ICD-10-CM | POA: Diagnosis not present

## 2021-05-17 DIAGNOSIS — Z6824 Body mass index (BMI) 24.0-24.9, adult: Secondary | ICD-10-CM | POA: Diagnosis not present

## 2021-05-17 DIAGNOSIS — Z299 Encounter for prophylactic measures, unspecified: Secondary | ICD-10-CM | POA: Diagnosis not present

## 2021-07-21 DIAGNOSIS — Z7189 Other specified counseling: Secondary | ICD-10-CM | POA: Diagnosis not present

## 2021-07-21 DIAGNOSIS — I10 Essential (primary) hypertension: Secondary | ICD-10-CM | POA: Diagnosis not present

## 2021-07-21 DIAGNOSIS — Z0000 Encounter for general adult medical examination without abnormal findings: Secondary | ICD-10-CM | POA: Diagnosis not present

## 2021-07-21 DIAGNOSIS — Z1331 Encounter for screening for depression: Secondary | ICD-10-CM | POA: Diagnosis not present

## 2021-07-21 DIAGNOSIS — Z79899 Other long term (current) drug therapy: Secondary | ICD-10-CM | POA: Diagnosis not present

## 2021-07-21 DIAGNOSIS — Z1211 Encounter for screening for malignant neoplasm of colon: Secondary | ICD-10-CM | POA: Diagnosis not present

## 2021-07-21 DIAGNOSIS — Z299 Encounter for prophylactic measures, unspecified: Secondary | ICD-10-CM | POA: Diagnosis not present

## 2021-07-21 DIAGNOSIS — Z6825 Body mass index (BMI) 25.0-25.9, adult: Secondary | ICD-10-CM | POA: Diagnosis not present

## 2021-07-21 DIAGNOSIS — Z1339 Encounter for screening examination for other mental health and behavioral disorders: Secondary | ICD-10-CM | POA: Diagnosis not present

## 2021-07-21 DIAGNOSIS — Z125 Encounter for screening for malignant neoplasm of prostate: Secondary | ICD-10-CM | POA: Diagnosis not present

## 2021-07-21 DIAGNOSIS — Z6824 Body mass index (BMI) 24.0-24.9, adult: Secondary | ICD-10-CM | POA: Diagnosis not present

## 2021-07-21 DIAGNOSIS — R5383 Other fatigue: Secondary | ICD-10-CM | POA: Diagnosis not present

## 2021-07-21 DIAGNOSIS — E7800 Pure hypercholesterolemia, unspecified: Secondary | ICD-10-CM | POA: Diagnosis not present

## 2021-08-21 DIAGNOSIS — Z23 Encounter for immunization: Secondary | ICD-10-CM | POA: Diagnosis not present

## 2021-08-21 DIAGNOSIS — G20 Parkinson's disease: Secondary | ICD-10-CM | POA: Diagnosis not present

## 2021-08-21 DIAGNOSIS — L84 Corns and callosities: Secondary | ICD-10-CM | POA: Diagnosis not present

## 2021-08-21 DIAGNOSIS — Z299 Encounter for prophylactic measures, unspecified: Secondary | ICD-10-CM | POA: Diagnosis not present

## 2021-08-21 DIAGNOSIS — N471 Phimosis: Secondary | ICD-10-CM | POA: Diagnosis not present

## 2021-08-21 DIAGNOSIS — I10 Essential (primary) hypertension: Secondary | ICD-10-CM | POA: Diagnosis not present
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
## 2021-09-25 ENCOUNTER — Other Ambulatory Visit: Payer: Self-pay

## 2021-09-25 ENCOUNTER — Ambulatory Visit (INDEPENDENT_AMBULATORY_CARE_PROVIDER_SITE_OTHER): Payer: Medicare Other | Admitting: Neurology

## 2021-09-25 ENCOUNTER — Encounter: Payer: Self-pay | Admitting: Neurology

## 2021-09-25 VITALS — BP 158/79 | HR 70 | Ht 68.0 in | Wt 161.6 lb

## 2021-09-25 DIAGNOSIS — G20 Parkinson's disease: Secondary | ICD-10-CM | POA: Diagnosis not present

## 2021-09-25 DIAGNOSIS — M4807 Spinal stenosis, lumbosacral region: Secondary | ICD-10-CM

## 2021-09-25 NOTE — Patient Instructions (Signed)
Continue carbidopa-levodopa 25/100mg  1 tablet at 4:30 AM, 1 tablet at 11:30 AM and 1 tablet at 6 PM Continue exercise, walking Follow up 6 months.

## 2021-12-01 DIAGNOSIS — Z6825 Body mass index (BMI) 25.0-25.9, adult: Secondary | ICD-10-CM | POA: Diagnosis not present

## 2021-12-01 DIAGNOSIS — L97529 Non-pressure chronic ulcer of other part of left foot with unspecified severity: Secondary | ICD-10-CM | POA: Diagnosis not present

## 2021-12-01 DIAGNOSIS — Z87891 Personal history of nicotine dependence: Secondary | ICD-10-CM | POA: Diagnosis not present

## 2021-12-01 DIAGNOSIS — Z299 Encounter for prophylactic measures, unspecified: Secondary | ICD-10-CM | POA: Diagnosis not present

## 2021-12-01 DIAGNOSIS — I10 Essential (primary) hypertension: Secondary | ICD-10-CM | POA: Diagnosis not present

## 2021-12-01 DIAGNOSIS — L03119 Cellulitis of unspecified part of limb: Secondary | ICD-10-CM | POA: Diagnosis not present

## 2021-12-01 DIAGNOSIS — G20 Parkinson's disease: Secondary | ICD-10-CM | POA: Diagnosis not present

## 2021-12-11 DIAGNOSIS — I70209 Unspecified atherosclerosis of native arteries of extremities, unspecified extremity: Secondary | ICD-10-CM | POA: Diagnosis not present

## 2021-12-11 DIAGNOSIS — I739 Peripheral vascular disease, unspecified: Secondary | ICD-10-CM | POA: Diagnosis not present

## 2021-12-14 DIAGNOSIS — L97529 Non-pressure chronic ulcer of other part of left foot with unspecified severity: Secondary | ICD-10-CM | POA: Diagnosis not present

## 2021-12-14 DIAGNOSIS — Z299 Encounter for prophylactic measures, unspecified: Secondary | ICD-10-CM | POA: Diagnosis not present

## 2021-12-14 DIAGNOSIS — K130 Diseases of lips: Secondary | ICD-10-CM | POA: Diagnosis not present

## 2021-12-14 DIAGNOSIS — I10 Essential (primary) hypertension: Secondary | ICD-10-CM | POA: Diagnosis not present

## 2021-12-14 DIAGNOSIS — Z6825 Body mass index (BMI) 25.0-25.9, adult: Secondary | ICD-10-CM | POA: Diagnosis not present

## 2021-12-14 DIAGNOSIS — I739 Peripheral vascular disease, unspecified: Secondary | ICD-10-CM | POA: Diagnosis not present
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
## 2022-01-23 DIAGNOSIS — C4401 Basal cell carcinoma of skin of lip: Secondary | ICD-10-CM | POA: Diagnosis not present

## 2022-01-23 DIAGNOSIS — D3701 Neoplasm of uncertain behavior of lip: Secondary | ICD-10-CM | POA: Diagnosis not present

## 2022-02-14 DIAGNOSIS — Z713 Dietary counseling and surveillance: Secondary | ICD-10-CM | POA: Diagnosis not present

## 2022-02-14 DIAGNOSIS — Z87891 Personal history of nicotine dependence: Secondary | ICD-10-CM | POA: Diagnosis not present

## 2022-02-14 DIAGNOSIS — I10 Essential (primary) hypertension: Secondary | ICD-10-CM | POA: Diagnosis not present

## 2022-02-14 DIAGNOSIS — Z6825 Body mass index (BMI) 25.0-25.9, adult: Secondary | ICD-10-CM | POA: Diagnosis not present

## 2022-02-14 DIAGNOSIS — Z299 Encounter for prophylactic measures, unspecified: Secondary | ICD-10-CM | POA: Diagnosis not present

## 2022-03-06 DIAGNOSIS — C000 Malignant neoplasm of external upper lip: Secondary | ICD-10-CM | POA: Diagnosis not present

## 2022-04-12 DIAGNOSIS — M48062 Spinal stenosis, lumbar region with neurogenic claudication: Secondary | ICD-10-CM | POA: Diagnosis not present

## 2022-04-23 DIAGNOSIS — M48062 Spinal stenosis, lumbar region with neurogenic claudication: Secondary | ICD-10-CM | POA: Diagnosis not present
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
## 2022-04-30 ENCOUNTER — Ambulatory Visit (INDEPENDENT_AMBULATORY_CARE_PROVIDER_SITE_OTHER): Payer: Medicare Other | Admitting: Neurology

## 2022-04-30 ENCOUNTER — Encounter: Payer: Self-pay | Admitting: Neurology

## 2022-04-30 VITALS — BP 150/80 | HR 72 | Ht 68.0 in | Wt 156.0 lb

## 2022-04-30 DIAGNOSIS — G20 Parkinson's disease: Secondary | ICD-10-CM | POA: Diagnosis not present

## 2022-04-30 DIAGNOSIS — M4807 Spinal stenosis, lumbosacral region: Secondary | ICD-10-CM | POA: Diagnosis not present

## 2022-04-30 MED ORDER — CARBIDOPA-LEVODOPA 25-100 MG PO TABS: 1.5000 | ORAL_TABLET | Freq: Four times a day (QID) | ORAL | 5 refills | Status: DC

## 2022-04-30 NOTE — Patient Instructions (Addendum)
Increase carbidopa-levodopa to 1.5 tablets four times daily (at 5:30 AM, 9 AM, 2 PM and 7 PM) Continue exercise Follow up 6 months.

## 2022-05-01 ENCOUNTER — Telehealth: Payer: Self-pay | Admitting: Neurology

## 2022-05-01 NOTE — Telephone Encounter (Signed)
Pt's daughter called in and left a message with the access nurse. She stated her father was in yesterday for an appointment and a prescription was going to be called in for him, but the pharmacy doesn't have a prescription. The name of the medication or pharmacy was not given.

## 2022-05-01 NOTE — Telephone Encounter (Signed)
Called Pt and she informed me that Drug store would not give them the new prescription. I called [facility] and was informed that the insurance would not refill until 7/11 due to he had refilled the old script on 5/21. Told her when he has 2 week leave call pharmacy back and then they will refill it. Call if she needs [place]

## 2022-05-21 DIAGNOSIS — M48062 Spinal stenosis, lumbar region with neurogenic claudication: Secondary | ICD-10-CM | POA: Diagnosis not present

## 2022-05-21 DIAGNOSIS — M5416 Radiculopathy, lumbar region: Secondary | ICD-10-CM | POA: Diagnosis not present

## 2022-05-21 DIAGNOSIS — Z6826 Body mass index (BMI) 26.0-26.9, adult: Secondary | ICD-10-CM | POA: Diagnosis not present

## 2022-07-26 DIAGNOSIS — E7800 Pure hypercholesterolemia, unspecified: Secondary | ICD-10-CM | POA: Diagnosis not present

## 2022-07-26 DIAGNOSIS — Z299 Encounter for prophylactic measures, unspecified: Secondary | ICD-10-CM | POA: Diagnosis not present

## 2022-07-26 DIAGNOSIS — Z6825 Body mass index (BMI) 25.0-25.9, adult: Secondary | ICD-10-CM | POA: Diagnosis not present

## 2022-07-26 DIAGNOSIS — Z1339 Encounter for screening examination for other mental health and behavioral disorders: Secondary | ICD-10-CM | POA: Diagnosis not present

## 2022-07-26 DIAGNOSIS — Z125 Encounter for screening for malignant neoplasm of prostate: Secondary | ICD-10-CM | POA: Diagnosis not present

## 2022-07-26 DIAGNOSIS — Z1331 Encounter for screening for depression: Secondary | ICD-10-CM | POA: Diagnosis not present

## 2022-07-26 DIAGNOSIS — I10 Essential (primary) hypertension: Secondary | ICD-10-CM | POA: Diagnosis not present

## 2022-07-26 DIAGNOSIS — Z23 Encounter for immunization: Secondary | ICD-10-CM | POA: Diagnosis not present

## 2022-07-26 DIAGNOSIS — Z0000 Encounter for general adult medical examination without abnormal findings: Secondary | ICD-10-CM | POA: Diagnosis not present

## 2022-07-26 DIAGNOSIS — R5383 Other fatigue: Secondary | ICD-10-CM | POA: Diagnosis not present

## 2022-07-26 DIAGNOSIS — Z79899 Other long term (current) drug therapy: Secondary | ICD-10-CM | POA: Diagnosis not present

## 2022-07-26 DIAGNOSIS — Z1211 Encounter for screening for malignant neoplasm of colon: Secondary | ICD-10-CM | POA: Diagnosis not present

## 2022-07-26 DIAGNOSIS — Z7189 Other specified counseling: Secondary | ICD-10-CM | POA: Diagnosis not present

## 2022-08-08 DIAGNOSIS — M5416 Radiculopathy, lumbar region: Secondary | ICD-10-CM | POA: Diagnosis not present

## 2022-09-10 DIAGNOSIS — M5416 Radiculopathy, lumbar region: Secondary | ICD-10-CM | POA: Diagnosis not present
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
## 2022-11-05 ENCOUNTER — Ambulatory Visit (INDEPENDENT_AMBULATORY_CARE_PROVIDER_SITE_OTHER): Payer: Medicare Other | Admitting: Neurology

## 2022-11-05 ENCOUNTER — Encounter: Payer: Self-pay | Admitting: Neurology

## 2022-11-05 VITALS — BP 154/78 | HR 72 | Ht 63.0 in | Wt 161.0 lb

## 2022-11-05 DIAGNOSIS — I10 Essential (primary) hypertension: Secondary | ICD-10-CM | POA: Diagnosis not present

## 2022-11-05 DIAGNOSIS — G20A1 Parkinson's disease without dyskinesia, without mention of fluctuations: Secondary | ICD-10-CM

## 2022-11-05 DIAGNOSIS — M4807 Spinal stenosis, lumbosacral region: Secondary | ICD-10-CM | POA: Diagnosis not present

## 2022-11-05 MED ORDER — CARBIDOPA-LEVODOPA 25-100 MG PO TABS: 1.5000 | ORAL_TABLET | Freq: Four times a day (QID) | ORAL | 5 refills | Status: DC

## 2022-11-05 NOTE — Patient Instructions (Addendum)
I wouldn't make changes to the medication.   Continue carbidopa-levodopa 25/'100mg'$  1.5 tablets QID (5:30 AM, 9 AM, 2PM and 7PM).   Follow up with PCP regarding blood pressure

## 2022-11-20 DIAGNOSIS — M5416 Radiculopathy, lumbar region: Secondary | ICD-10-CM | POA: Diagnosis not present

## 2022-12-17 DIAGNOSIS — M5416 Radiculopathy, lumbar region: Secondary | ICD-10-CM | POA: Diagnosis not present

## 2023-02-11 ENCOUNTER — Other Ambulatory Visit: Payer: Self-pay | Admitting: Pain Medicine

## 2023-02-11 DIAGNOSIS — M48062 Spinal stenosis, lumbar region with neurogenic claudication: Secondary | ICD-10-CM

## 2023-02-20 ENCOUNTER — Ambulatory Visit
Admission: RE | Admit: 2023-02-20 | Discharge: 2023-02-20 | Disposition: A | Discharge status: Home / Self Care | Payer: Medicare Other | Source: Ambulatory Visit | Attending: Pain Medicine | Admitting: Pain Medicine

## 2023-02-20 DIAGNOSIS — M4316 Spondylolisthesis, lumbar region: Secondary | ICD-10-CM | POA: Diagnosis not present

## 2023-02-20 DIAGNOSIS — M48062 Spinal stenosis, lumbar region with neurogenic claudication: Secondary | ICD-10-CM

## 2023-02-20 DIAGNOSIS — M545 Low back pain, unspecified: Secondary | ICD-10-CM | POA: Diagnosis not present

## 2023-03-06 DIAGNOSIS — Z299 Encounter for prophylactic measures, unspecified: Secondary | ICD-10-CM | POA: Diagnosis not present

## 2023-03-06 DIAGNOSIS — G20A1 Parkinson's disease without dyskinesia, without mention of fluctuations: Secondary | ICD-10-CM | POA: Diagnosis not present

## 2023-03-06 DIAGNOSIS — J441 Chronic obstructive pulmonary disease with (acute) exacerbation: Secondary | ICD-10-CM | POA: Diagnosis not present

## 2023-03-06 DIAGNOSIS — I739 Peripheral vascular disease, unspecified: Secondary | ICD-10-CM | POA: Diagnosis not present

## 2023-03-07 DIAGNOSIS — R059 Cough, unspecified: Secondary | ICD-10-CM | POA: Diagnosis not present

## 2023-03-07 DIAGNOSIS — K449 Diaphragmatic hernia without obstruction or gangrene: Secondary | ICD-10-CM | POA: Diagnosis not present

## 2023-03-12 DIAGNOSIS — M48062 Spinal stenosis, lumbar region with neurogenic claudication: Secondary | ICD-10-CM | POA: Diagnosis not present

## 2023-03-29 DIAGNOSIS — B029 Zoster without complications: Secondary | ICD-10-CM | POA: Diagnosis not present

## 2023-03-29 DIAGNOSIS — Z299 Encounter for prophylactic measures, unspecified: Secondary | ICD-10-CM | POA: Diagnosis not present

## 2023-03-29 DIAGNOSIS — I10 Essential (primary) hypertension: Secondary | ICD-10-CM | POA: Diagnosis not present

## 2023-04-18 DIAGNOSIS — M549 Dorsalgia, unspecified: Secondary | ICD-10-CM | POA: Diagnosis not present

## 2023-04-18 DIAGNOSIS — Z299 Encounter for prophylactic measures, unspecified: Secondary | ICD-10-CM | POA: Diagnosis not present

## 2023-04-18 DIAGNOSIS — M48061 Spinal stenosis, lumbar region without neurogenic claudication: Secondary | ICD-10-CM | POA: Diagnosis not present

## 2023-04-18 DIAGNOSIS — I10 Essential (primary) hypertension: Secondary | ICD-10-CM | POA: Diagnosis not present

## 2023-04-18 DIAGNOSIS — G20A1 Parkinson's disease without dyskinesia, without mention of fluctuations: Secondary | ICD-10-CM | POA: Diagnosis not present

## 2023-04-23 ENCOUNTER — Other Ambulatory Visit: Payer: Self-pay | Admitting: Neurosurgery

## 2023-04-25 ENCOUNTER — Encounter (HOSPITAL_COMMUNITY): Payer: Self-pay | Admitting: Neurosurgery

## 2023-04-25 NOTE — Progress Notes (Signed)
Unable to reach patient via phone.  LMOM with instructions for DOS.  Chest x-ray - 03/07/23 CE (2V)  EKG - DOS Stress Test - 05/17/10 ECHO - n/a Cardiac Cath - n/a  ICD Pacemaker/Loop - n/a  Sleep Study -  n/a CPAP - none  Diabetes Type - n/a  Aspirin Instructions: Follow your surgeon's instructions on when to stop aspirin prior to surgery,  If no instructions were given by your surgeon then you will need to call the office for those instructions.  NPO  Anesthesia review: Yes  STOP now taking any Aspirin (unless otherwise instructed by your surgeon), Voltaren, Aleve, Naproxen, Ibuprofen, Motrin, Advil, Goody's, BC's, all herbal medications, fish oil, and all vitamins.

## 2023-04-26 ENCOUNTER — Encounter (HOSPITAL_COMMUNITY): Payer: Self-pay | Admitting: Neurosurgery

## 2023-04-26 NOTE — Anesthesia Preprocedure Evaluation (Signed)
Anesthesia Evaluation  Patient identified by MRN, date of birth, ID band Patient awake    Reviewed: Allergy & Precautions, NPO status , Patient's Chart, lab work & pertinent test results, reviewed documented beta blocker date and time   Airway Mallampati: I  TM Distance: >3 FB Neck ROM: Full    Dental  (+) Dental Advisory Given, Edentulous Lower, Edentulous Upper   Pulmonary COPD, former smoker   Pulmonary exam normal breath sounds clear to auscultation       Cardiovascular hypertension, Pt. on medications and Pt. on home beta blockers Normal cardiovascular exam+ dysrhythmias (LBBB)  Rhythm:Regular Rate:Normal     Neuro/Psych PD Lumbar stenosis     GI/Hepatic Neg liver ROS, hiatal hernia,GERD  Medicated,,  Endo/Other  negative endocrine ROS    Renal/GU negative Renal ROS     Musculoskeletal  (+) Arthritis ,    Abdominal   Peds  Hematology negative hematology ROS (+)   Anesthesia Other Findings Day of surgery medications reviewed with the patient.  Reproductive/Obstetrics                             Anesthesia Physical Anesthesia Plan  ASA: 3  Anesthesia Plan: General   Post-op Pain Management: Tylenol PO (pre-op)*   Induction: Intravenous  PONV Risk Score and Plan: 2 and Dexamethasone and Ondansetron  Airway Management Planned: Oral ETT  Additional Equipment:   Intra-op Plan:   Post-operative Plan: Extubation in OR  Informed Consent: I have reviewed the patients History and Physical, chart, labs and discussed the procedure including the risks, benefits and alternatives for the proposed anesthesia with the patient or authorized representative who has indicated his/her understanding and acceptance.     Dental advisory given  Plan Discussed with: CRNA  Anesthesia Plan Comments: (PAT note written 04/26/2023 by Shonna Chock, PA-C. Same day work-up. Parklinson's, known  LBBB.   )       Anesthesia Quick Evaluation

## 2023-04-26 NOTE — Progress Notes (Signed)
Anesthesia Chart Review:  Case: [id number] Date/Time: 04/29/23 1234   Procedure: Sublaminar decompression - bilateral - L4-L5 (Bilateral: Back) - 3C   Anesthesia type: General   Pre-op diagnosis: Stenosis   [facility] ROOM 20 / [facility]   Surgeons: Donalee Citrin, MD       DISCUSSION: Patient is a 77 year old male scheduled for the above procedure.  History includes former smoker, HTN, left BBB (known at least since 05/17/10 with no significant ischemia on nuclear stress test), Parkinson's disease (diagnosed 2007), emphysema, GERD, hiatal hernia, osteoarthritis (left TKA 11/08/15), colon surgery.  He is a same day work-up. Anesthesia team to evaluate on the day of surgery. Labs and EKG on arrival as indicated.    VS: Wt 73 kg   BMI 28.52 kg/m    PROVIDERS: [name], MD is PCP  Shon Millet, DO is neurologist. Last visit 11/05/22.    LABS: For day of surgery as indicated.    IMAGES: CXR 03/07/23 [facility] CE): 1. No acute abnormality or explanation for cough.  2. Chronic biapical pleuroparenchymal scarring.  3. Large hiatal hernia.   MRI L-spine 02/20/23: IMPRESSION: 1. Severe spinal canal narrowing at L4-L5, slightly increased from prior exam. 2. Severe bilateral neural foraminal narrowing at L3-L4, L4-L5, and L5-S1, unchanged.      EKG: For day of surgery as indicated. He has known LBBB since at least 2011.  EKG 11/02/15:  Sinus rhythm with Fusion complexes Left bundle branch block Abnormal ECG Confirmed by Mariah Milling MD, TIMOTHY [phone]) on 11/02/2015 7:12:00 PM   CV: Nuclear stress test 05/17/10 [facility], scanned under Media tab, Correspondence, Enc Date 11/08/15): Impression: Resting EKG: Normal sinus rhythm with a left bundle branch block. Lexiscan myocardial perfusion study revealing a very small defect of the anteroseptum which was slightly more significant on the resting study compared to the rest study.  This appears to be due to motion artifact.  The  remainder of the walls were normally perfused.  There was no significant ischemia noted.  Calculated ejection fraction 65%.   Past Medical History:  Diagnosis Date   Arthritis    Emphysema lung (HCC)    GERD (gastroesophageal reflux disease)    History of blood transfusion 2016   History of hiatal hernia    Hypertension    Left bundle branch block (LBBB)    Parkinson's disease     Past Surgical History:  Procedure Laterality Date   ANKLE FRACTURE SURGERY     RIGHT   COLON SURGERY     KNEE ARTHROSCOPY     LEFT   TOTAL KNEE ARTHROPLASTY Left 11/08/2015   Procedure: TOTAL KNEE ARTHROPLASTY;  Surgeon: Valeria Batman, MD;  Location: [facility] OR;  Service: Orthopedics;  Laterality: Left;    MEDICATIONS: No current facility-administered medications for this encounter.    aspirin EC 81 MG tablet   carbidopa-levodopa (SINEMET IR) 25-100 MG tablet   diclofenac (VOLTAREN) 75 MG EC tablet   lisinopril (ZESTRIL) 20 MG tablet   metoprolol succinate (TOPROL-XL) 50 MG 24 hr tablet   omeprazole (PRILOSEC) 20 MG capsule    Shonna Chock, PA-C Surgical Short Stay/Anesthesiology [facility] Phone [phone] [facility] Phone [phone] 04/26/2023 1:26 PM

## 2023-04-29 ENCOUNTER — Ambulatory Visit (HOSPITAL_COMMUNITY): Payer: Medicare Other | Admitting: Vascular Surgery

## 2023-04-29 ENCOUNTER — Other Ambulatory Visit: Payer: Self-pay

## 2023-04-29 ENCOUNTER — Encounter (HOSPITAL_COMMUNITY): Payer: Self-pay | Admitting: Neurosurgery

## 2023-04-29 ENCOUNTER — Ambulatory Visit (HOSPITAL_COMMUNITY)
Admission: RE | Admit: 2023-04-29 | Discharge: 2023-04-30 | Disposition: A | Discharge status: Home / Self Care | Payer: Medicare Other | Attending: Neurosurgery | Admitting: Neurosurgery

## 2023-04-29 ENCOUNTER — Ambulatory Visit (HOSPITAL_BASED_OUTPATIENT_CLINIC_OR_DEPARTMENT_OTHER): Payer: Medicare Other | Admitting: Vascular Surgery

## 2023-04-29 ENCOUNTER — Ambulatory Visit (HOSPITAL_COMMUNITY): Payer: Medicare Other

## 2023-04-29 ENCOUNTER — Ambulatory Visit (HOSPITAL_COMMUNITY)
Admission: RE | Disposition: A | Discharge status: Home / Self Care | Payer: Self-pay | Source: Home / Self Care | Attending: Neurosurgery

## 2023-04-29 DIAGNOSIS — M7138 Other bursal cyst, other site: Secondary | ICD-10-CM | POA: Insufficient documentation

## 2023-04-29 DIAGNOSIS — Z79899 Other long term (current) drug therapy: Secondary | ICD-10-CM | POA: Diagnosis not present

## 2023-04-29 DIAGNOSIS — I10 Essential (primary) hypertension: Secondary | ICD-10-CM | POA: Insufficient documentation

## 2023-04-29 DIAGNOSIS — K449 Diaphragmatic hernia without obstruction or gangrene: Secondary | ICD-10-CM | POA: Diagnosis not present

## 2023-04-29 DIAGNOSIS — Z981 Arthrodesis status: Secondary | ICD-10-CM | POA: Diagnosis not present

## 2023-04-29 DIAGNOSIS — I447 Left bundle-branch block, unspecified: Secondary | ICD-10-CM | POA: Diagnosis not present

## 2023-04-29 DIAGNOSIS — G20A1 Parkinson's disease without dyskinesia, without mention of fluctuations: Secondary | ICD-10-CM | POA: Diagnosis not present

## 2023-04-29 DIAGNOSIS — Z87891 Personal history of nicotine dependence: Secondary | ICD-10-CM | POA: Diagnosis not present

## 2023-04-29 DIAGNOSIS — J449 Chronic obstructive pulmonary disease, unspecified: Secondary | ICD-10-CM

## 2023-04-29 DIAGNOSIS — K219 Gastro-esophageal reflux disease without esophagitis: Secondary | ICD-10-CM | POA: Insufficient documentation

## 2023-04-29 DIAGNOSIS — M48062 Spinal stenosis, lumbar region with neurogenic claudication: Secondary | ICD-10-CM | POA: Diagnosis not present

## 2023-04-29 DIAGNOSIS — Z96652 Presence of left artificial knee joint: Secondary | ICD-10-CM | POA: Diagnosis not present

## 2023-04-29 DIAGNOSIS — M48061 Spinal stenosis, lumbar region without neurogenic claudication: Secondary | ICD-10-CM

## 2023-04-29 DIAGNOSIS — J439 Emphysema, unspecified: Secondary | ICD-10-CM | POA: Insufficient documentation

## 2023-04-29 DIAGNOSIS — M5416 Radiculopathy, lumbar region: Secondary | ICD-10-CM | POA: Diagnosis not present

## 2023-04-29 HISTORY — PX: LUMBAR LAMINECTOMY/DECOMPRESSION MICRODISCECTOMY: SHX5026

## 2023-04-29 HISTORY — DX: Left bundle-branch block, unspecified: I44.7

## 2023-04-29 LAB — CBC
HCT: 44.1 % (ref 39.0–52.0)
Hemoglobin: 14.2 g/dL (ref 13.0–17.0)
MCH: 31.6 pg (ref 26.0–34.0)
MCHC: 32.2 g/dL (ref 30.0–36.0)
MCV: 98.2 fL (ref 80.0–100.0)
Platelets: 190 10*3/uL (ref 150–400)
RBC: 4.49 MIL/uL (ref 4.22–5.81)
RDW: 13.8 % (ref 11.5–15.5)
WBC: 6.5 10*3/uL (ref 4.0–10.5)
nRBC: 0 % (ref 0.0–0.2)

## 2023-04-29 LAB — BASIC METABOLIC PANEL
Anion gap: 8 (ref 5–15)
BUN: 16 mg/dL (ref 8–23)
CO2: 24 mmol/L (ref 22–32)
Calcium: 9.1 mg/dL (ref 8.9–10.3)
Chloride: 108 mmol/L (ref 98–111)
Creatinine, Ser: 0.73 mg/dL (ref 0.61–1.24)
GFR, Estimated: >60 mL/min (ref >60)
Glucose, Bld: 106 mg/dL — ABNORMAL HIGH (ref 70–99)
Potassium: 3.7 mmol/L (ref 3.5–5.1)
Sodium: 140 mmol/L (ref 135–145)

## 2023-04-29 LAB — SURGICAL PCR SCREEN
MRSA, PCR: NEGATIVE
Staphylococcus aureus: POSITIVE — AB

## 2023-04-29 SURGERY — LUMBAR LAMINECTOMY/DECOMPRESSION MICRODISCECTOMY 1 LEVEL
Anesthesia: General | Site: Back | Laterality: Bilateral

## 2023-04-29 MED ORDER — CHLORHEXIDINE GLUCONATE CLOTH 2 % EX PADS: 6.0000 | MEDICATED_PAD | Freq: Once | CUTANEOUS | Status: DC

## 2023-04-29 MED ORDER — ORAL CARE MOUTH RINSE: 15.0000 mL | Freq: Once | OROMUCOSAL | Status: AC

## 2023-04-29 MED ORDER — ACETAMINOPHEN 650 MG RE SUPP: 650.0000 mg | RECTAL | Status: DC | PRN

## 2023-04-29 MED ORDER — DEXAMETHASONE SODIUM PHOSPHATE 10 MG/ML IJ SOLN
INTRAMUSCULAR | Status: AC
  Filled 2023-04-29: qty 1

## 2023-04-29 MED ORDER — PANTOPRAZOLE SODIUM 40 MG IV SOLR
40.0000 mg | Freq: Every day | INTRAVENOUS | Status: DC
Start: 2023-04-29 — End: 2023-04-29

## 2023-04-29 MED ORDER — ACETAMINOPHEN 500 MG PO TABS
1000.0000 mg | ORAL_TABLET | Freq: Once | ORAL | Status: AC
  Administered 2023-04-29: 1000 mg via ORAL
  Filled 2023-04-29: qty 2

## 2023-04-29 MED ORDER — FENTANYL CITRATE (PF) 100 MCG/2ML IJ SOLN: 25.0000 ug | INTRAMUSCULAR | Status: DC | PRN

## 2023-04-29 MED ORDER — LIDOCAINE 2% (20 MG/ML) 5 ML SYRINGE
INTRAMUSCULAR | Status: DC | PRN
  Administered 2023-04-29: 80 mg via INTRAVENOUS

## 2023-04-29 MED ORDER — MENTHOL 3 MG MT LOZG: 1.0000 | LOZENGE | OROMUCOSAL | Status: DC | PRN

## 2023-04-29 MED ORDER — CARBIDOPA-LEVODOPA 25-100 MG PO TABS
1.5000 | ORAL_TABLET | Freq: Four times a day (QID) | ORAL | Status: DC
  Administered 2023-04-29 – 2023-04-30 (×4): 1.5 via ORAL
  Filled 2023-04-29 (×4): qty 2

## 2023-04-29 MED ORDER — ASPIRIN 81 MG PO TBEC
81.0000 mg | DELAYED_RELEASE_TABLET | Freq: Every day | ORAL | Status: DC
  Administered 2023-04-29: 81 mg via ORAL
  Filled 2023-04-29: qty 1

## 2023-04-29 MED ORDER — CHLORHEXIDINE GLUCONATE 0.12 % MT SOLN
OROMUCOSAL | Status: AC
  Administered 2023-04-29: 15 mL via OROMUCOSAL
  Filled 2023-04-29: qty 15

## 2023-04-29 MED ORDER — PROPOFOL 10 MG/ML IV BOLUS
INTRAVENOUS | Status: DC | PRN
  Administered 2023-04-29: 100 mg via INTRAVENOUS

## 2023-04-29 MED ORDER — LIDOCAINE-EPINEPHRINE 1 %-1:100000 IJ SOLN
INTRAMUSCULAR | Status: DC | PRN
  Administered 2023-04-29: 10 mL

## 2023-04-29 MED ORDER — BUPIVACAINE HCL (PF) 0.25 % IJ SOLN
INTRAMUSCULAR | Status: DC | PRN
  Administered 2023-04-29: 10 mL

## 2023-04-29 MED ORDER — PHENOL 1.4 % MT LIQD: 1.0000 | OROMUCOSAL | Status: DC | PRN

## 2023-04-29 MED ORDER — FENTANYL CITRATE (PF) 250 MCG/5ML IJ SOLN
INTRAMUSCULAR | Status: AC
  Filled 2023-04-29: qty 5

## 2023-04-29 MED ORDER — PANTOPRAZOLE SODIUM 40 MG PO TBEC
40.0000 mg | DELAYED_RELEASE_TABLET | Freq: Every day | ORAL | Status: DC
  Administered 2023-04-29: 40 mg via ORAL
  Filled 2023-04-29: qty 1

## 2023-04-29 MED ORDER — ROCURONIUM BROMIDE 10 MG/ML (PF) SYRINGE
PREFILLED_SYRINGE | INTRAVENOUS | Status: AC
  Filled 2023-04-29: qty 10

## 2023-04-29 MED ORDER — THROMBIN 5000 UNITS EX SOLR
CUTANEOUS | Status: AC
  Filled 2023-04-29: qty 10000

## 2023-04-29 MED ORDER — CHLORHEXIDINE GLUCONATE 0.12 % MT SOLN: 15.0000 mL | Freq: Once | OROMUCOSAL | Status: AC

## 2023-04-29 MED ORDER — CYCLOBENZAPRINE HCL 10 MG PO TABS: 10.0000 mg | ORAL_TABLET | Freq: Three times a day (TID) | ORAL | Status: DC | PRN

## 2023-04-29 MED ORDER — DICLOFENAC SODIUM 75 MG PO TBEC
75.0000 mg | DELAYED_RELEASE_TABLET | Freq: Two times a day (BID) | ORAL | Status: DC
  Administered 2023-04-29: 75 mg via ORAL
  Filled 2023-04-29 (×2): qty 1

## 2023-04-29 MED ORDER — PHENYLEPHRINE 80 MCG/ML (10ML) SYRINGE FOR IV PUSH (FOR BLOOD PRESSURE SUPPORT)
PREFILLED_SYRINGE | INTRAVENOUS | Status: DC | PRN
  Administered 2023-04-29: 80 ug via INTRAVENOUS
  Administered 2023-04-29 (×2): 120 ug via INTRAVENOUS
  Administered 2023-04-29: 80 ug via INTRAVENOUS

## 2023-04-29 MED ORDER — SODIUM CHLORIDE 0.9% FLUSH: 3.0000 mL | INTRAVENOUS | Status: DC | PRN

## 2023-04-29 MED ORDER — LIDOCAINE 2% (20 MG/ML) 5 ML SYRINGE
INTRAMUSCULAR | Status: AC
  Filled 2023-04-29: qty 5

## 2023-04-29 MED ORDER — ROCURONIUM BROMIDE 10 MG/ML (PF) SYRINGE
PREFILLED_SYRINGE | INTRAVENOUS | Status: DC | PRN
  Administered 2023-04-29: 20 mg via INTRAVENOUS
  Administered 2023-04-29: 50 mg via INTRAVENOUS

## 2023-04-29 MED ORDER — LACTATED RINGERS IV SOLN: INTRAVENOUS | Status: DC

## 2023-04-29 MED ORDER — ACETAMINOPHEN 325 MG PO TABS: 650.0000 mg | ORAL_TABLET | ORAL | Status: DC | PRN

## 2023-04-29 MED ORDER — ONDANSETRON HCL 4 MG/2ML IJ SOLN: 4.0000 mg | Freq: Once | INTRAMUSCULAR | Status: DC | PRN

## 2023-04-29 MED ORDER — CEFAZOLIN SODIUM-DEXTROSE 2-4 GM/100ML-% IV SOLN
2.0000 g | Freq: Three times a day (TID) | INTRAVENOUS | Status: AC
  Administered 2023-04-29 – 2023-04-30 (×2): 2 g via INTRAVENOUS
  Filled 2023-04-29 (×2): qty 100

## 2023-04-29 MED ORDER — BUPIVACAINE HCL (PF) 0.25 % IJ SOLN
INTRAMUSCULAR | Status: AC
  Filled 2023-04-29: qty 30

## 2023-04-29 MED ORDER — FENTANYL CITRATE (PF) 250 MCG/5ML IJ SOLN
INTRAMUSCULAR | Status: DC | PRN
  Administered 2023-04-29: 50 ug via INTRAVENOUS
  Administered 2023-04-29: 25 ug via INTRAVENOUS

## 2023-04-29 MED ORDER — SODIUM CHLORIDE 0.9 % IV SOLN: 250.0000 mL | INTRAVENOUS | Status: DC

## 2023-04-29 MED ORDER — HYDROCODONE-ACETAMINOPHEN 5-325 MG PO TABS
2.0000 | ORAL_TABLET | ORAL | Status: DC | PRN
  Administered 2023-04-29: 2 via ORAL
  Filled 2023-04-29: qty 2

## 2023-04-29 MED ORDER — METOPROLOL SUCCINATE ER 50 MG PO TB24: 50.0000 mg | ORAL_TABLET | Freq: Every day | ORAL | Status: DC

## 2023-04-29 MED ORDER — ONDANSETRON HCL 4 MG/2ML IJ SOLN
INTRAMUSCULAR | Status: AC
  Filled 2023-04-29: qty 2

## 2023-04-29 MED ORDER — THROMBIN (RECOMBINANT) 5000 UNITS EX SOLR
CUTANEOUS | Status: DC | PRN
  Administered 2023-04-29: 10 mL via TOPICAL

## 2023-04-29 MED ORDER — LIDOCAINE-EPINEPHRINE 1 %-1:100000 IJ SOLN
INTRAMUSCULAR | Status: AC
  Filled 2023-04-29: qty 1

## 2023-04-29 MED ORDER — CEFAZOLIN SODIUM-DEXTROSE 2-4 GM/100ML-% IV SOLN
2.0000 g | INTRAVENOUS | Status: AC
  Administered 2023-04-29: 2 g via INTRAVENOUS
  Filled 2023-04-29: qty 100

## 2023-04-29 MED ORDER — DEXAMETHASONE SODIUM PHOSPHATE 10 MG/ML IJ SOLN
INTRAMUSCULAR | Status: DC | PRN
  Administered 2023-04-29: 10 mg via INTRAVENOUS

## 2023-04-29 MED ORDER — 0.9 % SODIUM CHLORIDE (POUR BTL) OPTIME
TOPICAL | Status: DC | PRN
  Administered 2023-04-29: 1000 mL

## 2023-04-29 MED ORDER — ONDANSETRON HCL 4 MG PO TABS: 4.0000 mg | ORAL_TABLET | Freq: Four times a day (QID) | ORAL | Status: DC | PRN

## 2023-04-29 MED ORDER — ONDANSETRON HCL 4 MG/2ML IJ SOLN
INTRAMUSCULAR | Status: DC | PRN
  Administered 2023-04-29: 4 mg via INTRAVENOUS

## 2023-04-29 MED ORDER — ONDANSETRON HCL 4 MG/2ML IJ SOLN: 4.0000 mg | Freq: Four times a day (QID) | INTRAMUSCULAR | Status: DC | PRN

## 2023-04-29 MED ORDER — LISINOPRIL 20 MG PO TABS
20.0000 mg | ORAL_TABLET | Freq: Every day | ORAL | Status: DC
  Administered 2023-04-29: 20 mg via ORAL
  Filled 2023-04-29: qty 1

## 2023-04-29 MED ORDER — SUGAMMADEX SODIUM 200 MG/2ML IV SOLN
INTRAVENOUS | Status: DC | PRN
  Administered 2023-04-29: 200 mg via INTRAVENOUS

## 2023-04-29 MED ORDER — SODIUM CHLORIDE 0.9% FLUSH: 3.0000 mL | Freq: Two times a day (BID) | INTRAVENOUS | Status: DC

## 2023-04-29 MED ORDER — ALUM & MAG HYDROXIDE-SIMETH 200-200-20 MG/5ML PO SUSP: 30.0000 mL | Freq: Four times a day (QID) | ORAL | Status: DC | PRN

## 2023-04-29 MED ORDER — HYDROMORPHONE HCL 1 MG/ML IJ SOLN: 0.5000 mg | INTRAMUSCULAR | Status: DC | PRN

## 2023-04-29 SURGICAL SUPPLY — 50 items
ADH SKN CLS APL DERMABOND .7 (GAUZE/BANDAGES/DRESSINGS) ×1
APL SKNCLS STERI-STRIP NONHPOA (GAUZE/BANDAGES/DRESSINGS) ×1
BAG COUNTER SPONGE SURGICOUNT (BAG) ×1 IMPLANT
BAG SPNG CNTER NS LX DISP (BAG) ×2
BENZOIN TINCTURE PRP APPL 2/3 (GAUZE/BANDAGES/DRESSINGS) ×1 IMPLANT
BLADE CLIPPER SURG (BLADE) IMPLANT
BLADE SURG 11 STRL SS (BLADE) ×1 IMPLANT
BUR CUTTER 7.0 ROUND (BURR) ×1 IMPLANT
BUR MATCHSTICK NEURO 3.0 LAGG (BURR) ×1 IMPLANT
CANISTER SUCT 3000ML PPV (MISCELLANEOUS) ×1 IMPLANT
DERMABOND ADVANCED .7 DNX12 (GAUZE/BANDAGES/DRESSINGS) ×1 IMPLANT
DRAPE HALF SHEET 40X57 (DRAPES) IMPLANT
DRAPE LAPAROTOMY 100X72X124 (DRAPES) ×1 IMPLANT
DRAPE MICROSCOPE SLANT 54X150 (MISCELLANEOUS) ×1 IMPLANT
DRAPE SURG 17X23 STRL (DRAPES) ×1 IMPLANT
DRSG OPSITE POSTOP 4X6 (GAUZE/BANDAGES/DRESSINGS) IMPLANT
DURAPREP 26ML APPLICATOR (WOUND CARE) ×1 IMPLANT
ELECT REM PT RETURN 9FT ADLT (ELECTROSURGICAL) ×1
ELECTRODE REM PT RTRN 9FT ADLT (ELECTROSURGICAL) ×1 IMPLANT
GAUZE 4X4 16PLY ~~LOC~~+RFID DBL (SPONGE) IMPLANT
GAUZE SPONGE 4X4 12PLY STRL (GAUZE/BANDAGES/DRESSINGS) ×1 IMPLANT
GLOVE BIO SURGEON STRL SZ7 (GLOVE) IMPLANT
GLOVE BIO SURGEON STRL SZ8 (GLOVE) ×1 IMPLANT
GLOVE BIOGEL PI IND STRL 7.0 (GLOVE) IMPLANT
GLOVE EXAM NITRILE XL STR (GLOVE) IMPLANT
GLOVE INDICATOR 8.5 STRL (GLOVE) ×2 IMPLANT
GOWN STRL REUS W/ TWL LRG LVL3 (GOWN DISPOSABLE) ×1 IMPLANT
GOWN STRL REUS W/ TWL XL LVL3 (GOWN DISPOSABLE) ×2 IMPLANT
GOWN STRL REUS W/TWL 2XL LVL3 (GOWN DISPOSABLE) IMPLANT
GOWN STRL REUS W/TWL LRG LVL3 (GOWN DISPOSABLE) ×1
GOWN STRL REUS W/TWL XL LVL3 (GOWN DISPOSABLE) ×2
KIT BASIN OR (CUSTOM PROCEDURE TRAY) ×1 IMPLANT
KIT TURNOVER KIT B (KITS) ×1 IMPLANT
NDL SPNL 22GX3.5 QUINCKE BK (NEEDLE) ×1 IMPLANT
NEEDLE HYPO 22GX1.5 SAFETY (NEEDLE) ×1 IMPLANT
NEEDLE SPNL 22GX3.5 QUINCKE BK (NEEDLE) ×1 IMPLANT
NS IRRIG 1000ML POUR BTL (IV SOLUTION) ×1 IMPLANT
PACK LAMINECTOMY NEURO (CUSTOM PROCEDURE TRAY) ×1 IMPLANT
SOL ELECTROSURG ANTI STICK (MISCELLANEOUS) ×1
SOLUTION ELECTROSURG ANTI STCK (MISCELLANEOUS) ×1 IMPLANT
SPIKE FLUID TRANSFER (MISCELLANEOUS) ×1 IMPLANT
SPONGE SURGIFOAM ABS GEL SZ50 (HEMOSTASIS) ×1 IMPLANT
STRIP CLOSURE SKIN 1/2X4 (GAUZE/BANDAGES/DRESSINGS) ×1 IMPLANT
SUT VIC AB 0 CT1 18XCR BRD8 (SUTURE) ×1 IMPLANT
SUT VIC AB 0 CT1 8-18 (SUTURE) ×1
SUT VIC AB 2-0 CT1 18 (SUTURE) ×1 IMPLANT
SUT VICRYL 4-0 PS2 18IN ABS (SUTURE) ×1 IMPLANT
TOWEL GREEN STERILE (TOWEL DISPOSABLE) ×1 IMPLANT
TOWEL GREEN STERILE FF (TOWEL DISPOSABLE) ×1 IMPLANT
WATER STERILE IRR 1000ML POUR (IV SOLUTION) ×1 IMPLANT

## 2023-04-29 NOTE — Op Note (Signed)
Preoperative diagnosis: Lumbar spinal stenosis L4-5 bilateral L4-L5 radiculopathies and neurogenic medication.  Postoperative diagnosis: Same with synovial cyst L4-5.  Procedure: Decompressive lumbar laminectomy bilateral partial medial facetectomies foraminotomies of the L4 and L5 nerve roots and resection of synovial cyst.  Surgeon: Donalee Citrin.  Assistant: Julien Girt.  Anesthesia: General.  EBL: Minimal.  HPI: 77 year old gentleman progressive worsening back bilateral hip and leg pain workup revealed severe spinal stenosis at L4-5 due to patient failed conservative treatment imaging findings and progression of clinical syndrome I recommended decompressive laminectomy at L4-5 with partial medial facetectomies and foraminotomies of the L4 and L5 nerve roots.  I extensively reviewed the risks and benefits of the operation as well as perioperative course expectations of outcome and alternatives of surgery and he understood and agreed to proceed forward.  Operative procedure: Patient was brought into the OR was induced under general anesthesia positioned prone the Wilson frame his back was prepped and draped in routine sterile fashion.  Preoperative x-ray localized the appropriate level.  After infiltration of 10 cc lidocaine with epi midline incision was made and Bovie cautery was used take down subcutaneous tissue and subperiosteal dissection was carried lamina of L4 and L5.  Marked facet arthropathy was medial identified with a large external synovial cyst these were all removed interoperative x-ray confirmed application appropriate level prior to this.  Spinous process of L4 and the superior aspect spinous process L5 was then removed lamina was drilled down with a high-speed drill laminotomy was begun with a 3 Miller Kerrison punch.  Marching superiorly after about two thirds of the lamina of L4 was removed identified dura then marching inferiorly marked hourglass compression thecal sac at L4-5  was identified with large medially projecting spurs coming off the facet joint these were all teased off of the dura and removed in piecemeal fashion immediately identified also a synovial cyst central in the canal coming up primarily the right side of 4 5 facet this was all teased off of the dura and removed in piecemeal fashion and after removal of this the thecal sac was decompressed centrally foraminotomies were performed of the L4 and L5 nerve roots bilaterally and after adequate central and foraminal decompression was achieved the wound was then copiously irrigated physical hemostasis was maintained Gelfoam was awaiting top of the dura the muscle fascia approximate layers with active Vicryl skin was closed running 4 subcuticular.  Dermabond benzoin Steri-Strips and sterile dressing was applied patient recovery in stable condition.  At the end the case all needle count sponge counts were correct.

## 2023-04-29 NOTE — Transfer of Care (Signed)
Immediate Anesthesia Transfer of Care Note  Patient: Jeff Greer  Procedure(s) Performed: Sublaminar decompression - bilateral - Lumbar Four-Lumbar Five (Bilateral: Back)  Patient Location: PACU  Anesthesia Type:General  Level of Consciousness: awake, oriented, and drowsy  Airway & Oxygen Therapy: Patient Spontanous Breathing  Post-op Assessment: Report given to RN, Post -op Vital signs reviewed and stable, and Patient moving all extremities  Post vital signs: stable  Last Vitals:  Vitals Value Taken Time  BP 141/76 04/29/23 1447  Temp    Pulse 74 04/29/23 1454  Resp 16 04/29/23 1454  SpO2 96 % 04/29/23 1454  Vitals shown include unvalidated device data.  Last Pain:  Vitals:   04/29/23 1059  TempSrc:   PainSc: 0-No pain         Complications: No notable events documented.

## 2023-04-29 NOTE — Anesthesia Procedure Notes (Signed)
Procedure Name: Intubation Date/Time: 04/29/2023 1:10 PM  Performed by: Kayleen Memos, CRNAPre-anesthesia Checklist: Patient identified, Emergency Drugs available, Suction available, Patient being monitored and Timeout performed Patient Re-evaluated:Patient Re-evaluated prior to induction Oxygen Delivery Method: Circle system utilized Preoxygenation: Pre-oxygenation with 100% oxygen Induction Type: IV induction Ventilation: Mask ventilation without difficulty Laryngoscope Size: Mac and 3 Grade View: Grade I Tube type: Oral Tube size: 7.5 mm Number of attempts: 1 Airway Equipment and Method: Stylet Placement Confirmation: ETT inserted through vocal cords under direct vision, positive ETCO2, CO2 detector and breath sounds checked- equal and bilateral Secured at: 22 cm Tube secured with: Tape Dental Injury: Teeth and Oropharynx as per pre-operative assessment

## 2023-04-29 NOTE — H&P (Signed)
Jeff Greer is an 77 y.o. male.   Chief Complaint: Back bilateral hip pain neurogenic claudication HPI: 77 year old gentleman with progressive worsening back and bilateral hip and leg pain referable to L4 and L5 workup revealed severe spinal stenosis with virtual complete block and CSF flow patient failed all forms conservative treatment I recommended decompressive laminectomy facetectomies foraminotomies at that level.  I extensively over the risks and benefits of the operation with him as well as perioperative course expectations of outcome and alternatives of surgery and he understood and agreed to proceed forward.  Past Medical History:  Diagnosis Date   Arthritis    Emphysema lung (HCC)    GERD (gastroesophageal reflux disease)    History of blood transfusion 2016   History of hiatal hernia    Hypertension    Left bundle branch block (LBBB)    Parkinson's disease     Past Surgical History:  Procedure Laterality Date   ANKLE FRACTURE SURGERY     RIGHT   COLON SURGERY     KNEE ARTHROSCOPY     LEFT   TOTAL KNEE ARTHROPLASTY Left 11/08/2015   Procedure: TOTAL KNEE ARTHROPLASTY;  Surgeon: Valeria Batman, MD;  Location: [facility] OR;  Service: Orthopedics;  Laterality: Left;    Family History  Problem Relation Age of Onset   Brain cancer Mother    Diabetes Brother    Social History:  reports that he quit smoking about 39 years ago. His smoking use included cigarettes. He has never used smokeless tobacco. He reports current alcohol use. He reports that he does not use drugs.  Allergies:  Allergies  Allergen Reactions   Morphine And Codeine     HALLUCINATE    Medications Prior to Admission  Medication Sig Dispense Refill   aspirin EC 81 MG tablet Take 81 mg by mouth daily.     carbidopa-levodopa (SINEMET IR) 25-100 MG tablet Take 1.5 tablets by mouth 4 (four) times daily. Take at 5:30 AM, 9 AM, 2 PM, and 7 PM. 180 tablet 5   diclofenac (VOLTAREN) 75 MG EC tablet Take 75 mg  by mouth 2 (two) times daily.     lisinopril (ZESTRIL) 20 MG tablet Take 20 mg by mouth daily.     metoprolol succinate (TOPROL-XL) 50 MG 24 hr tablet Take 1 tablet by mouth daily.     omeprazole (PRILOSEC) 20 MG capsule Take 20 mg by mouth daily as needed (acid reflux).      Results for orders placed or performed during the hospital encounter of 04/29/23 (from the past 48 hour(s))  CBC per protocol     Status: None   Collection Time: 04/29/23 10:56 AM  Result Value Ref Range   WBC 6.5 4.0 - 10.5 K/uL   RBC 4.49 4.22 - 5.81 MIL/uL   Hemoglobin 14.2 13.0 - 17.0 g/dL   HCT 16.1 09.6 - 04.5 %   MCV 98.2 80.0 - 100.0 fL   MCH 31.6 26.0 - 34.0 pg   MCHC 32.2 30.0 - 36.0 g/dL   RDW 40.9 81.1 - 91.4 %   Platelets 190 150 - 400 K/uL   nRBC 0.0 0.0 - 0.2 %    Comment: Performed at [facility], [address], [place], [place] [address]   No results found.  Review of Systems  Musculoskeletal:  Positive for back pain.  Neurological:  Positive for numbness.    Blood pressure (!) 155/94, pulse 72, temperature 98 F (36.7 C), temperature source Oral,  resp. rate 18, height 5\' 8"  (1.727 m), weight 68 kg, SpO2 97 %. Physical Exam HENT:     Head: Normocephalic.     Right Ear: Tympanic membrane normal.     Nose: Nose normal.     Mouth/Throat:     Mouth: Mucous membranes are moist.  Eyes:     Pupils: Pupils are equal, round, and reactive to light.  Cardiovascular:     Rate and Rhythm: Normal rate.     Pulses: Normal pulses.  Pulmonary:     Effort: Pulmonary effort is normal.  Abdominal:     General: Abdomen is flat.  Musculoskeletal:        General: Normal range of motion.     Cervical back: Normal range of motion.  Neurological:     Mental Status: He is alert.     Comments: Strength is 5 out of 5 iliopsoas, quads, hamstrings, gastroc, into tibialis, and EHL.      Assessment/Plan 77 year old presents for decompressive laminectomy L4-5  Mariam Dollar, MD 04/29/2023, 12:26  PM

## 2023-04-30 ENCOUNTER — Encounter (HOSPITAL_COMMUNITY): Payer: Self-pay | Admitting: Neurosurgery

## 2023-04-30 DIAGNOSIS — I447 Left bundle-branch block, unspecified: Secondary | ICD-10-CM | POA: Diagnosis not present

## 2023-04-30 DIAGNOSIS — M5416 Radiculopathy, lumbar region: Secondary | ICD-10-CM | POA: Diagnosis not present

## 2023-04-30 DIAGNOSIS — I10 Essential (primary) hypertension: Secondary | ICD-10-CM | POA: Diagnosis not present

## 2023-04-30 DIAGNOSIS — G20A1 Parkinson's disease without dyskinesia, without mention of fluctuations: Secondary | ICD-10-CM | POA: Diagnosis not present

## 2023-04-30 DIAGNOSIS — M48062 Spinal stenosis, lumbar region with neurogenic claudication: Secondary | ICD-10-CM | POA: Diagnosis not present

## 2023-04-30 DIAGNOSIS — M7138 Other bursal cyst, other site: Secondary | ICD-10-CM | POA: Diagnosis not present

## 2023-04-30 MED FILL — Thrombin For Soln 5000 Unit: CUTANEOUS | Qty: 2 | Status: AC

## 2023-04-30 NOTE — Discharge Summary (Signed)
Physician Discharge Summary  Patient ID: Jeff Greer MRN: [id number] DOB/AGE: [birth year] 77 y.o.  Admit date: 04/29/2023 Discharge date: 04/30/2023  Admission Diagnoses: Lumbar spinal stenosis L4-5 bilateral L4-L5 radiculopathies and neurogenic medication.     Discharge Diagnoses: same   Discharged Condition: good  Hospital Course: The patient was admitted on 04/29/2023 and taken to the operating room where the patient underwent lumbar lami L4-5. The patient tolerated the procedure well and was taken to the recovery room and then to the floor in stable condition. The hospital course was routine. There were no complications. The wound remained clean dry and intact. Pt had appropriate back soreness. No complaints of leg pain or new N/T/W. The patient remained afebrile with stable vital signs, and tolerated a regular diet. The patient continued to increase activities, and pain was well controlled with oral pain medications.   Consults: None  Significant Diagnostic Studies:  Results for orders placed or performed during the hospital encounter of 04/29/23  Surgical pcr screen   Specimen: Nasal Mucosa; Nasal Swab  Result Value Ref Range   MRSA, PCR NEGATIVE NEGATIVE   Staphylococcus aureus POSITIVE (A) NEGATIVE  CBC per protocol  Result Value Ref Range   WBC 6.5 4.0 - 10.5 K/uL   RBC 4.49 4.22 - 5.81 MIL/uL   Hemoglobin 14.2 13.0 - 17.0 g/dL   HCT 29.5 28.4 - 13.2 %   MCV 98.2 80.0 - 100.0 fL   MCH 31.6 26.0 - 34.0 pg   MCHC 32.2 30.0 - 36.0 g/dL   RDW 44.0 10.2 - 72.5 %   Platelets 190 150 - 400 K/uL   nRBC 0.0 0.0 - 0.2 %  Basic metabolic panel  Result Value Ref Range   Sodium 140 135 - 145 mmol/L   Potassium 3.7 3.5 - 5.1 mmol/L   Chloride 108 98 - 111 mmol/L   CO2 24 22 - 32 mmol/L   Glucose, Bld 106 (H) 70 - 99 mg/dL   BUN 16 8 - 23 mg/dL   Creatinine, Ser 3.66 0.61 - 1.24 mg/dL   Calcium 9.1 8.9 - 44.0 mg/dL   GFR, Estimated >34 >74 mL/min   Anion gap 8 5 - 15     DG Lumbar Spine 2-3 Views  Result Date: 04/29/2023 CLINICAL DATA:  Intraoperative localization EXAM: LUMBAR SPINE - 2-3 lateral views COMPARISON:  MRI lumbar spine done on 02/20/2023 FINDINGS: Tip of localizing probe is noted in the posterior elements at L4-L5 level. There is decrease in height of bodies of L2 and L3 vertebrae. This finding has not changed in comparison with MRI done on 02/20/2023. IMPRESSION: Portable cross-table lateral views of lumbar spine were done for intraoperative localization. Electronically Signed   By: Ernie Avena M.D.   On: 04/29/2023 18:53    Antibiotics:  Anti-infectives (From admission, onward)    Start     Dose/Rate Route Frequency Ordered Stop   04/29/23 2100  ceFAZolin (ANCEF) IVPB 2g/100 mL premix        2 g 200 mL/hr over 30 Minutes Intravenous Every 8 hours 04/29/23 1610 04/30/23 0424   04/29/23 1045  ceFAZolin (ANCEF) IVPB 2g/100 mL premix        2 g 200 mL/hr over 30 Minutes Intravenous On call to O.R. 04/29/23 1032 04/29/23 1325       Discharge Exam: Blood pressure 132/86, pulse 80, temperature 98.4 F (36.9 C), temperature source Oral, resp. rate 16, height 5\' 8"  (1.727 m), weight 68 kg, SpO2 97 %. Neurologic:  Grossly normal Ambulating and voiding well incision cdi   Discharge Medications:   Allergies as of 04/30/2023       Reactions   Morphine And Codeine    HALLUCINATE        Medication List     TAKE these medications    aspirin EC 81 MG tablet Take 81 mg by mouth daily.   carbidopa-levodopa 25-100 MG tablet Commonly known as: SINEMET IR Take 1.5 tablets by mouth 4 (four) times daily. Take at 5:30 AM, 9 AM, 2 PM, and 7 PM.   diclofenac 75 MG EC tablet Commonly known as: VOLTAREN Take 75 mg by mouth 2 (two) times daily.   lisinopril 20 MG tablet Commonly known as: ZESTRIL Take 20 mg by mouth daily.   metoprolol succinate 50 MG 24 hr tablet Commonly known as: TOPROL-XL Take 1 tablet by mouth daily.    omeprazole 20 MG capsule Commonly known as: PRILOSEC Take 20 mg by mouth daily as needed (acid reflux).        Disposition: home   Final Dx: lumbar lami L4-5  Discharge Instructions      Remove dressing in 72 hours   Complete by: As directed    Call MD for:  difficulty breathing, headache or visual disturbances   Complete by: As directed    Call MD for:  hives   Complete by: As directed    Call MD for:  persistant dizziness or light-headedness   Complete by: As directed    Call MD for:  persistant nausea and vomiting   Complete by: As directed    Call MD for:  redness, tenderness, or signs of infection (pain, swelling, redness, odor or green/yellow discharge around incision site)   Complete by: As directed    Call MD for:  severe uncontrolled pain   Complete by: As directed    Call MD for:  temperature >100.4   Complete by: As directed    Diet - low sodium heart healthy   Complete by: As directed    Driving Restrictions   Complete by: As directed    No driving for 2 weeks, no riding in the car for 1 week   Increase activity slowly   Complete by: As directed    Lifting restrictions   Complete by: As directed    No lifting more than 8 lbs          Signed: Tiana Loft Nagi Furio 04/30/2023, 8:18 AM

## 2023-04-30 NOTE — Anesthesia Postprocedure Evaluation (Signed)
Anesthesia Post Note  Patient: Jeff Greer  Procedure(s) Performed: Sublaminar decompression - bilateral - Lumbar Four-Lumbar Five (Bilateral: Back)     Patient location during evaluation: PACU Anesthesia Type: General Level of consciousness: awake and alert Pain management: pain level controlled Vital Signs Assessment: post-procedure vital signs reviewed and stable Respiratory status: spontaneous breathing, nonlabored ventilation, respiratory function stable and patient connected to nasal cannula oxygen Cardiovascular status: blood pressure returned to baseline and stable Postop Assessment: no apparent nausea or vomiting Anesthetic complications: no   No notable events documented.  Last Vitals:  Vitals:   04/30/23 0334 04/30/23 0731  BP: 128/85 132/86  Pulse: 80 80  Resp: 18 16  Temp: 36.7 C 36.9 C  SpO2: 95% 97%    Last Pain:  Vitals:   04/30/23 0745  TempSrc:   PainSc: 0-No pain   Pain Goal: Patients Stated Pain Goal: 3 (04/29/23 1710)                 Collene Schlichter

## 2023-04-30 NOTE — Plan of Care (Signed)
Pt doing well. Pt and daughter given D/C instructions with verbal understanding. Pt's incision is clean and dry with no sign of infection. Pt's IV was removed prior to D/C. Pt D/C'd home via wheelchair per MD order. Pt is stable @ D/C and has no other needs at this time. Nyala Kirchner, RN  

## 2023-04-30 NOTE — Evaluation (Signed)
Occupational Therapy Evaluation Patient Details Name: Jeff Greer MRN: [id number] DOB: 12/08/1945 Today's Date: 04/30/2023   History of Present Illness 77 yo male s/p 6/10 decompression lumbar laminectomy bil partial medial facetectomies foraminotomies L4 and L5 nerve root with resection of synovial cyst PMH arthritis, HTN LBBB parkinsons R ankle surg L TKA   Clinical Impression   Patient evaluated by Occupational Therapy with no further acute OT needs identified. All education has been completed and the patient has no further questions. See below for any follow-up Occupational Therapy or equipment needs. OT to sign off. Thank you for referral.   Pt completed stair transfer during session without concern.s      Recommendations for follow up therapy are one component of a multi-disciplinary discharge planning process, led by the attending physician.  Recommendations may be updated based on patient status, additional functional criteria and insurance authorization.   Assistance Recommended at Discharge None  Patient can return home with the following Assist for transportation    Functional Status Assessment  Patient has had a recent decline in their functional status and demonstrates the ability to make significant improvements in function in a reasonable and predictable amount of time.  Equipment Recommendations  None recommended by OT    Recommendations for Other Services       Precautions / Restrictions Precautions Precautions: Back Precaution Comments: handout provided and reviewed for adls with back precautions      Mobility Bed Mobility Overal bed mobility: Modified Independent             General bed mobility comments: cues for sequenec    Transfers Overall transfer level: Modified independent                        Balance Overall balance assessment: Mild deficits observed, not formally tested                                          ADL either performed or assessed with clinical judgement   ADL Overall ADL's : Modified independent                                       General ADL Comments: dressed for home during session with figure 4 cross. pt able to demonstrate bed mobility. daughter present for all education     Vision Baseline Vision/History: 0 No visual deficits Ability to See in Adequate Light: 0 Adequate Patient Visual Report: No change from baseline Vision Assessment?: No apparent visual deficits     Perception     Praxis      Pertinent Vitals/Pain Pain Assessment Pain Assessment: 0-10 Pain Score: 2  Pain Location: back Pain Descriptors / Indicators: Discomfort Pain Intervention(s): Monitored during session, Premedicated before session, Repositioned     Hand Dominance Right   Extremity/Trunk Assessment Upper Extremity Assessment Upper Extremity Assessment: Overall WFL for tasks assessed   Lower Extremity Assessment Lower Extremity Assessment: Overall WFL for tasks assessed   Cervical / Trunk Assessment Cervical / Trunk Assessment: Back Surgery   Communication Communication Communication: No difficulties   Cognition Arousal/Alertness: Awake/alert Behavior During Therapy: WFL for tasks assessed/performed Overall Cognitive Status: Within Functional Limits for tasks assessed  General Comments  incision dry and intact. pt declined use of RW and educated on use for pain management at home. has RW at home    Exercises     Shoulder Instructions      Home Living Family/patient expects to be discharged to:: Private residence Living Arrangements: Children Available Help at Discharge: Available 24 hours/day;Family Type of Home: House Home Access: Stairs to enter Secretary/administrator of Steps: 3 Entrance Stairs-Rails: None Home Layout: One level     Bathroom Shower/Tub: Contractor: Standard     Home Equipment: Agricultural consultant (2 wheels)   Additional Comments: laundry is in the basement but daughter or girlfriend will help. Girlfriend brings dog to home and will care for it.      Prior Functioning/Environment Prior Level of Function : Independent/Modified Independent                        OT Problem List:        OT Treatment/Interventions:      OT Goals(Current goals can be found in the care plan section) Acute Rehab OT Goals Patient Stated Goal: to go home today OT Goal Formulation: With patient  OT Frequency:      Co-evaluation              AM-PAC OT "6 Clicks" Daily Activity     Outcome Measure Help from another person eating meals?: None Help from another person taking care of personal grooming?: None Help from another person toileting, which includes using toliet, bedpan, or urinal?: None Help from another person bathing (including washing, rinsing, drying)?: None Help from another person to put on and taking off regular upper body clothing?: None Help from another person to put on and taking off regular lower body clothing?: None 6 Click Score: 24   End of Session Nurse Communication: Mobility status;Precautions  Activity Tolerance: Patient tolerated treatment well Patient left: in bed;with call bell/phone within reach;with family/visitor present  OT Visit Diagnosis: Unsteadiness on feet (R26.81)                Time: 6063-0160 OT Time Calculation (min): 24 min Charges:  OT General Charges $OT Visit: 1 Visit OT Evaluation $OT Eval Moderate Complexity: 1 Mod   Brynn, OTR/L  Acute Rehabilitation Services Office: [phone] .   Mateo Flow 04/30/2023, 12:11 PM

## 2023-05-07 ENCOUNTER — Ambulatory Visit: Payer: Medicare Other | Admitting: Neurology

## 2023-07-01 ENCOUNTER — Other Ambulatory Visit: Payer: Self-pay | Admitting: Neurology
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
## 2023-07-23 ENCOUNTER — Ambulatory Visit (INDEPENDENT_AMBULATORY_CARE_PROVIDER_SITE_OTHER): Payer: Medicare Other | Admitting: Neurology

## 2023-07-23 ENCOUNTER — Encounter: Payer: Self-pay | Admitting: Neurology

## 2023-07-23 VITALS — BP 160/80 | HR 72 | Ht 68.0 in | Wt 158.0 lb

## 2023-07-23 DIAGNOSIS — I10 Essential (primary) hypertension: Secondary | ICD-10-CM | POA: Diagnosis not present

## 2023-07-23 DIAGNOSIS — G20A1 Parkinson's disease without dyskinesia, without mention of fluctuations: Secondary | ICD-10-CM | POA: Diagnosis not present

## 2023-07-23 MED ORDER — CARBIDOPA-LEVODOPA 25-100 MG PO TABS: ORAL_TABLET | ORAL | 6 refills | Status: DC

## 2023-07-23 NOTE — Patient Instructions (Addendum)
Continue carbidopa-levadopa Follow up 6-7 months

## 2023-08-19 DIAGNOSIS — E7800 Pure hypercholesterolemia, unspecified: Secondary | ICD-10-CM | POA: Diagnosis not present

## 2023-08-19 DIAGNOSIS — Z299 Encounter for prophylactic measures, unspecified: Secondary | ICD-10-CM | POA: Diagnosis not present

## 2023-08-19 DIAGNOSIS — Z1331 Encounter for screening for depression: Secondary | ICD-10-CM | POA: Diagnosis not present

## 2023-08-19 DIAGNOSIS — I10 Essential (primary) hypertension: Secondary | ICD-10-CM | POA: Diagnosis not present

## 2023-08-19 DIAGNOSIS — G20A1 Parkinson's disease without dyskinesia, without mention of fluctuations: Secondary | ICD-10-CM | POA: Diagnosis not present

## 2023-08-19 DIAGNOSIS — Z1211 Encounter for screening for malignant neoplasm of colon: Secondary | ICD-10-CM | POA: Diagnosis not present

## 2023-08-19 DIAGNOSIS — Z1339 Encounter for screening examination for other mental health and behavioral disorders: Secondary | ICD-10-CM | POA: Diagnosis not present

## 2023-08-19 DIAGNOSIS — Z23 Encounter for immunization: Secondary | ICD-10-CM | POA: Diagnosis not present

## 2023-08-19 DIAGNOSIS — Z0000 Encounter for general adult medical examination without abnormal findings: Secondary | ICD-10-CM | POA: Diagnosis not present

## 2023-08-19 DIAGNOSIS — R5383 Other fatigue: Secondary | ICD-10-CM | POA: Diagnosis not present

## 2023-08-19 DIAGNOSIS — Z6824 Body mass index (BMI) 24.0-24.9, adult: Secondary | ICD-10-CM | POA: Diagnosis not present

## 2023-08-19 DIAGNOSIS — Z7189 Other specified counseling: Secondary | ICD-10-CM | POA: Diagnosis not present

## 2023-08-19 DIAGNOSIS — Z125 Encounter for screening for malignant neoplasm of prostate: Secondary | ICD-10-CM | POA: Diagnosis not present

## 2023-08-19 DIAGNOSIS — Z79899 Other long term (current) drug therapy: Secondary | ICD-10-CM | POA: Diagnosis not present

## 2023-08-28 DIAGNOSIS — H02051 Trichiasis without entropian right upper eyelid: Secondary | ICD-10-CM | POA: Diagnosis not present

## 2023-08-28 DIAGNOSIS — B0233 Zoster keratitis: Secondary | ICD-10-CM | POA: Diagnosis not present

## 2023-09-03 DIAGNOSIS — H16231 Neurotrophic keratoconjunctivitis, right eye: Secondary | ICD-10-CM | POA: Diagnosis not present

## 2023-09-03 DIAGNOSIS — B0233 Zoster keratitis: Secondary | ICD-10-CM | POA: Diagnosis not present

## 2023-09-10 DIAGNOSIS — H16231 Neurotrophic keratoconjunctivitis, right eye: Secondary | ICD-10-CM | POA: Diagnosis not present

## 2023-09-10 DIAGNOSIS — B0233 Zoster keratitis: Secondary | ICD-10-CM | POA: Diagnosis not present

## 2023-09-24 DIAGNOSIS — B0233 Zoster keratitis: Secondary | ICD-10-CM | POA: Diagnosis not present

## 2023-09-24 DIAGNOSIS — H16231 Neurotrophic keratoconjunctivitis, right eye: Secondary | ICD-10-CM | POA: Diagnosis not present

## 2023-09-24 DIAGNOSIS — H16001 Unspecified corneal ulcer, right eye: Secondary | ICD-10-CM | POA: Diagnosis not present

## 2023-10-08 DIAGNOSIS — H16231 Neurotrophic keratoconjunctivitis, right eye: Secondary | ICD-10-CM | POA: Diagnosis not present

## 2023-10-08 DIAGNOSIS — B0233 Zoster keratitis: Secondary | ICD-10-CM | POA: Diagnosis not present

## 2023-10-08 DIAGNOSIS — H16001 Unspecified corneal ulcer, right eye: Secondary | ICD-10-CM | POA: Diagnosis not present

## 2023-10-14 DIAGNOSIS — B0233 Zoster keratitis: Secondary | ICD-10-CM | POA: Diagnosis not present

## 2023-10-14 DIAGNOSIS — H16001 Unspecified corneal ulcer, right eye: Secondary | ICD-10-CM | POA: Diagnosis not present

## 2023-10-14 DIAGNOSIS — H02051 Trichiasis without entropian right upper eyelid: Secondary | ICD-10-CM | POA: Diagnosis not present

## 2023-10-14 DIAGNOSIS — H16231 Neurotrophic keratoconjunctivitis, right eye: Secondary | ICD-10-CM | POA: Diagnosis not present

## 2023-10-22 DIAGNOSIS — H16001 Unspecified corneal ulcer, right eye: Secondary | ICD-10-CM | POA: Diagnosis not present

## 2023-10-22 DIAGNOSIS — H16231 Neurotrophic keratoconjunctivitis, right eye: Secondary | ICD-10-CM | POA: Diagnosis not present

## 2023-10-22 DIAGNOSIS — B0233 Zoster keratitis: Secondary | ICD-10-CM | POA: Diagnosis not present

## 2023-10-23 DIAGNOSIS — Z299 Encounter for prophylactic measures, unspecified: Secondary | ICD-10-CM | POA: Diagnosis not present

## 2023-10-23 DIAGNOSIS — J449 Chronic obstructive pulmonary disease, unspecified: Secondary | ICD-10-CM | POA: Diagnosis not present

## 2023-10-23 DIAGNOSIS — G20A1 Parkinson's disease without dyskinesia, without mention of fluctuations: Secondary | ICD-10-CM | POA: Diagnosis not present

## 2023-10-23 DIAGNOSIS — I10 Essential (primary) hypertension: Secondary | ICD-10-CM | POA: Diagnosis not present

## 2023-11-19 DIAGNOSIS — B0233 Zoster keratitis: Secondary | ICD-10-CM | POA: Diagnosis not present

## 2023-11-19 DIAGNOSIS — H16231 Neurotrophic keratoconjunctivitis, right eye: Secondary | ICD-10-CM | POA: Diagnosis not present

## 2023-11-19 DIAGNOSIS — H16001 Unspecified corneal ulcer, right eye: Secondary | ICD-10-CM | POA: Diagnosis not present

## 2023-12-02 DIAGNOSIS — J449 Chronic obstructive pulmonary disease, unspecified: Secondary | ICD-10-CM | POA: Diagnosis not present

## 2023-12-02 DIAGNOSIS — J069 Acute upper respiratory infection, unspecified: Secondary | ICD-10-CM | POA: Diagnosis not present

## 2023-12-02 DIAGNOSIS — G20A1 Parkinson's disease without dyskinesia, without mention of fluctuations: Secondary | ICD-10-CM | POA: Diagnosis not present

## 2023-12-02 DIAGNOSIS — Z299 Encounter for prophylactic measures, unspecified: Secondary | ICD-10-CM | POA: Diagnosis not present

## 2023-12-10 DIAGNOSIS — H16231 Neurotrophic keratoconjunctivitis, right eye: Secondary | ICD-10-CM | POA: Diagnosis not present

## 2023-12-10 DIAGNOSIS — H16001 Unspecified corneal ulcer, right eye: Secondary | ICD-10-CM | POA: Diagnosis not present

## 2023-12-10 DIAGNOSIS — B0233 Zoster keratitis: Secondary | ICD-10-CM | POA: Diagnosis not present

## 2024-01-14 DIAGNOSIS — H16001 Unspecified corneal ulcer, right eye: Secondary | ICD-10-CM | POA: Diagnosis not present

## 2024-01-14 DIAGNOSIS — H16231 Neurotrophic keratoconjunctivitis, right eye: Secondary | ICD-10-CM | POA: Diagnosis not present

## 2024-01-14 DIAGNOSIS — B0233 Zoster keratitis: Secondary | ICD-10-CM | POA: Diagnosis not present
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
## 2024-01-21 ENCOUNTER — Encounter: Payer: Self-pay | Admitting: Neurology

## 2024-01-21 ENCOUNTER — Ambulatory Visit (INDEPENDENT_AMBULATORY_CARE_PROVIDER_SITE_OTHER): Payer: Medicare Other | Admitting: Neurology

## 2024-01-21 VITALS — BP 160/80 | HR 63 | Ht 68.0 in | Wt 157.0 lb

## 2024-01-21 DIAGNOSIS — G20A1 Parkinson's disease without dyskinesia, without mention of fluctuations: Secondary | ICD-10-CM

## 2024-01-21 DIAGNOSIS — M4807 Spinal stenosis, lumbosacral region: Secondary | ICD-10-CM

## 2024-01-21 DIAGNOSIS — I10 Essential (primary) hypertension: Secondary | ICD-10-CM | POA: Diagnosis not present

## 2024-01-21 MED ORDER — CARBIDOPA-LEVODOPA 25-100 MG PO TABS: ORAL_TABLET | ORAL | 6 refills | Status: DC

## 2024-01-21 NOTE — Patient Instructions (Signed)
 Continue carbidopa-levodopa 1 1/2 tablets at 4 AM, 10 AM, 4 PM and 10 PM Use walking stick in the woods. Please have somebody else use the chainsaw Follow up 6 months.

## 2024-02-21 DIAGNOSIS — Z299 Encounter for prophylactic measures, unspecified: Secondary | ICD-10-CM | POA: Diagnosis not present

## 2024-02-21 DIAGNOSIS — G20A1 Parkinson's disease without dyskinesia, without mention of fluctuations: Secondary | ICD-10-CM | POA: Diagnosis not present

## 2024-02-21 DIAGNOSIS — I10 Essential (primary) hypertension: Secondary | ICD-10-CM | POA: Diagnosis not present

## 2024-04-14 DIAGNOSIS — H02051 Trichiasis without entropian right upper eyelid: Secondary | ICD-10-CM | POA: Diagnosis not present

## 2024-04-14 DIAGNOSIS — H16231 Neurotrophic keratoconjunctivitis, right eye: Secondary | ICD-10-CM | POA: Diagnosis not present

## 2024-04-14 DIAGNOSIS — H02511 Abnormal innervation syndrome right upper eyelid: Secondary | ICD-10-CM | POA: Diagnosis not present

## 2024-04-14 DIAGNOSIS — B0233 Zoster keratitis: Secondary | ICD-10-CM | POA: Diagnosis not present
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
## 2024-07-24 ENCOUNTER — Encounter: Payer: Self-pay | Admitting: Neurology

## 2024-07-24 ENCOUNTER — Ambulatory Visit (INDEPENDENT_AMBULATORY_CARE_PROVIDER_SITE_OTHER): Admitting: Neurology

## 2024-07-24 VITALS — BP 149/82 | HR 100 | Ht 68.0 in | Wt 150.0 lb

## 2024-07-24 DIAGNOSIS — G20A1 Parkinson's disease without dyskinesia, without mention of fluctuations: Secondary | ICD-10-CM

## 2024-07-24 NOTE — Patient Instructions (Signed)
 Carbidopa -levodopa  25/100mg  1.5 tablets 4 times daily (4 AM, 10 AM, 4 PM, and 10 PM).  I would no longer go into the woods or use the chainsaw Routine exercise

## 2024-08-20 DIAGNOSIS — Z0000 Encounter for general adult medical examination without abnormal findings: Secondary | ICD-10-CM | POA: Diagnosis not present

## 2024-08-20 DIAGNOSIS — Z299 Encounter for prophylactic measures, unspecified: Secondary | ICD-10-CM | POA: Diagnosis not present

## 2024-08-20 DIAGNOSIS — R5383 Other fatigue: Secondary | ICD-10-CM | POA: Diagnosis not present

## 2024-08-20 DIAGNOSIS — Z7189 Other specified counseling: Secondary | ICD-10-CM | POA: Diagnosis not present

## 2024-08-20 DIAGNOSIS — Z1331 Encounter for screening for depression: Secondary | ICD-10-CM | POA: Diagnosis not present

## 2024-08-20 DIAGNOSIS — E7800 Pure hypercholesterolemia, unspecified: Secondary | ICD-10-CM | POA: Diagnosis not present

## 2024-08-20 DIAGNOSIS — Z23 Encounter for immunization: Secondary | ICD-10-CM | POA: Diagnosis not present

## 2024-08-20 DIAGNOSIS — I10 Essential (primary) hypertension: Secondary | ICD-10-CM | POA: Diagnosis not present

## 2024-08-20 DIAGNOSIS — Z1339 Encounter for screening examination for other mental health and behavioral disorders: Secondary | ICD-10-CM | POA: Diagnosis not present

## 2024-08-24 DIAGNOSIS — R5383 Other fatigue: Secondary | ICD-10-CM | POA: Diagnosis not present

## 2024-08-24 DIAGNOSIS — Z0000 Encounter for general adult medical examination without abnormal findings: Secondary | ICD-10-CM | POA: Diagnosis not present

## 2024-08-24 DIAGNOSIS — Z125 Encounter for screening for malignant neoplasm of prostate: Secondary | ICD-10-CM | POA: Diagnosis not present

## 2024-08-24 DIAGNOSIS — E7800 Pure hypercholesterolemia, unspecified: Secondary | ICD-10-CM | POA: Diagnosis not present

## 2024-08-24 DIAGNOSIS — Z79899 Other long term (current) drug therapy: Secondary | ICD-10-CM | POA: Diagnosis not present

## 2024-09-23 ENCOUNTER — Other Ambulatory Visit: Payer: Self-pay | Admitting: Neurology

## 2025-01-22 ENCOUNTER — Ambulatory Visit: Admitting: Neurology
